# Patient Record
Sex: Male | Born: 1960 | Race: Black or African American | Hispanic: No | Marital: Single | Smoking: Never smoker
Health system: Southern US, Community
[De-identification: ages and names within clinical notes are randomized; demographics above are authoritative.]

## PROBLEM LIST (undated history)

## (undated) DIAGNOSIS — H35033 Hypertensive retinopathy, bilateral: Secondary | ICD-10-CM

## (undated) DIAGNOSIS — I10 Essential (primary) hypertension: Secondary | ICD-10-CM

## (undated) DIAGNOSIS — I639 Cerebral infarction, unspecified: Secondary | ICD-10-CM

## (undated) DIAGNOSIS — N189 Chronic kidney disease, unspecified: Secondary | ICD-10-CM

---

## 2003-08-20 ENCOUNTER — Emergency Department (HOSPITAL_COMMUNITY): Admission: EM | Admit: 2003-08-20 | Discharge: 2003-08-20 | Payer: Self-pay | Admitting: Emergency Medicine

## 2005-01-08 ENCOUNTER — Emergency Department (HOSPITAL_COMMUNITY): Admission: EM | Admit: 2005-01-08 | Discharge: 2005-01-08 | Payer: Self-pay | Admitting: Emergency Medicine

## 2005-03-08 ENCOUNTER — Emergency Department (HOSPITAL_COMMUNITY): Admission: EM | Admit: 2005-03-08 | Discharge: 2005-03-08 | Payer: Self-pay | Admitting: Emergency Medicine

## 2005-12-06 ENCOUNTER — Emergency Department (HOSPITAL_COMMUNITY): Admission: AD | Admit: 2005-12-06 | Discharge: 2005-12-06 | Payer: Self-pay | Admitting: Emergency Medicine

## 2005-12-11 ENCOUNTER — Emergency Department (HOSPITAL_COMMUNITY): Admission: EM | Admit: 2005-12-11 | Discharge: 2005-12-11 | Payer: Self-pay | Admitting: Family Medicine

## 2005-12-11 ENCOUNTER — Ambulatory Visit (HOSPITAL_COMMUNITY): Admission: RE | Admit: 2005-12-11 | Discharge: 2005-12-11 | Payer: Self-pay | Admitting: Family Medicine

## 2005-12-12 ENCOUNTER — Emergency Department (HOSPITAL_COMMUNITY): Admission: EM | Admit: 2005-12-12 | Discharge: 2005-12-12 | Payer: Self-pay | Admitting: Emergency Medicine

## 2005-12-14 ENCOUNTER — Inpatient Hospital Stay (HOSPITAL_COMMUNITY): Admission: EM | Admit: 2005-12-14 | Discharge: 2005-12-16 | Payer: Self-pay | Admitting: Emergency Medicine

## 2005-12-15 ENCOUNTER — Encounter (INDEPENDENT_AMBULATORY_CARE_PROVIDER_SITE_OTHER): Payer: Self-pay | Admitting: Cardiology

## 2006-03-27 ENCOUNTER — Inpatient Hospital Stay (HOSPITAL_COMMUNITY): Admission: EM | Admit: 2006-03-27 | Discharge: 2006-03-29 | Payer: Self-pay | Admitting: Emergency Medicine

## 2006-06-08 ENCOUNTER — Ambulatory Visit: Payer: Self-pay | Admitting: *Deleted

## 2006-08-17 ENCOUNTER — Ambulatory Visit: Payer: Self-pay | Admitting: Family Medicine

## 2006-09-16 ENCOUNTER — Ambulatory Visit: Payer: Self-pay | Admitting: Family Medicine

## 2006-09-29 ENCOUNTER — Ambulatory Visit: Payer: Self-pay | Admitting: Family Medicine

## 2006-11-16 ENCOUNTER — Ambulatory Visit: Payer: Self-pay | Admitting: Family Medicine

## 2006-12-14 ENCOUNTER — Encounter (INDEPENDENT_AMBULATORY_CARE_PROVIDER_SITE_OTHER): Payer: Self-pay | Admitting: Family Medicine

## 2006-12-14 ENCOUNTER — Ambulatory Visit: Payer: Self-pay | Admitting: Internal Medicine

## 2006-12-14 LAB — CONVERTED CEMR LAB
HDL: 29 mg/dL — ABNORMAL LOW (ref >39)
LDL Cholesterol: 127 mg/dL — ABNORMAL HIGH (ref 0–99)
Triglycerides: 228 mg/dL — ABNORMAL HIGH (ref <150)

## 2007-02-13 ENCOUNTER — Inpatient Hospital Stay (HOSPITAL_COMMUNITY): Admission: EM | Admit: 2007-02-13 | Discharge: 2007-02-16 | Payer: Self-pay | Admitting: Emergency Medicine

## 2007-06-08 ENCOUNTER — Ambulatory Visit: Payer: Self-pay | Admitting: Family Medicine

## 2007-07-20 ENCOUNTER — Ambulatory Visit: Payer: Self-pay | Admitting: Family Medicine

## 2007-07-20 LAB — CONVERTED CEMR LAB
BUN: 21 mg/dL (ref 6–23)
Cholesterol: 212 mg/dL — ABNORMAL HIGH (ref 0–200)
Creatinine, Ser: 1.68 mg/dL — ABNORMAL HIGH (ref 0.40–1.50)
Glucose, Bld: 111 mg/dL — ABNORMAL HIGH (ref 70–99)
HDL: 28 mg/dL — ABNORMAL LOW (ref >39)
Potassium: 4.3 meq/L (ref 3.5–5.3)
Total CHOL/HDL Ratio: 7.6
Triglycerides: 218 mg/dL — ABNORMAL HIGH (ref <150)

## 2007-09-02 ENCOUNTER — Ambulatory Visit: Payer: Self-pay | Admitting: Internal Medicine

## 2007-11-13 ENCOUNTER — Emergency Department (HOSPITAL_COMMUNITY): Admission: EM | Admit: 2007-11-13 | Discharge: 2007-11-13 | Payer: Self-pay | Admitting: Emergency Medicine

## 2007-11-23 ENCOUNTER — Inpatient Hospital Stay (HOSPITAL_COMMUNITY): Admission: EM | Admit: 2007-11-23 | Discharge: 2007-11-24 | Payer: Self-pay | Admitting: Emergency Medicine

## 2008-01-14 ENCOUNTER — Emergency Department (HOSPITAL_COMMUNITY): Admission: EM | Admit: 2008-01-14 | Discharge: 2008-01-14 | Payer: Self-pay | Admitting: Emergency Medicine

## 2008-10-11 ENCOUNTER — Emergency Department (HOSPITAL_COMMUNITY): Admission: EM | Admit: 2008-10-11 | Discharge: 2008-10-11 | Payer: Self-pay | Admitting: Emergency Medicine

## 2008-11-08 ENCOUNTER — Ambulatory Visit: Payer: Self-pay | Admitting: Family Medicine

## 2008-11-13 ENCOUNTER — Ambulatory Visit: Payer: Self-pay | Admitting: Internal Medicine

## 2009-08-31 ENCOUNTER — Inpatient Hospital Stay (HOSPITAL_COMMUNITY): Admission: EM | Admit: 2009-08-31 | Discharge: 2009-09-12 | Payer: Self-pay | Admitting: Emergency Medicine

## 2009-08-31 ENCOUNTER — Ambulatory Visit: Payer: Self-pay | Admitting: Cardiology

## 2009-09-01 ENCOUNTER — Encounter (INDEPENDENT_AMBULATORY_CARE_PROVIDER_SITE_OTHER): Payer: Self-pay | Admitting: Neurology

## 2009-09-03 ENCOUNTER — Ambulatory Visit: Payer: Self-pay | Admitting: Surgery

## 2009-09-03 ENCOUNTER — Encounter: Payer: Self-pay | Admitting: Cardiology

## 2009-09-06 ENCOUNTER — Ambulatory Visit: Payer: Self-pay | Admitting: Physical Medicine & Rehabilitation

## 2009-09-08 ENCOUNTER — Encounter (INDEPENDENT_AMBULATORY_CARE_PROVIDER_SITE_OTHER): Payer: Self-pay | Admitting: Neurology

## 2009-09-12 ENCOUNTER — Inpatient Hospital Stay (HOSPITAL_COMMUNITY)
Admission: RE | Admit: 2009-09-12 | Discharge: 2009-10-04 | Payer: Self-pay | Admitting: Physical Medicine & Rehabilitation

## 2009-09-15 ENCOUNTER — Ambulatory Visit: Payer: Self-pay | Admitting: Physical Medicine & Rehabilitation

## 2009-09-19 ENCOUNTER — Ambulatory Visit: Payer: Self-pay | Admitting: Psychology

## 2009-11-07 ENCOUNTER — Encounter
Admission: RE | Admit: 2009-11-07 | Discharge: 2009-11-13 | Payer: Self-pay | Admitting: Physical Medicine & Rehabilitation

## 2009-11-09 ENCOUNTER — Emergency Department (HOSPITAL_COMMUNITY): Admission: EM | Admit: 2009-11-09 | Discharge: 2009-11-09 | Payer: Self-pay | Admitting: Emergency Medicine

## 2009-11-13 ENCOUNTER — Ambulatory Visit: Payer: Self-pay | Admitting: Physical Medicine & Rehabilitation

## 2009-12-28 ENCOUNTER — Ambulatory Visit: Payer: Self-pay | Admitting: Family Medicine

## 2010-01-01 ENCOUNTER — Emergency Department (HOSPITAL_COMMUNITY): Admission: EM | Admit: 2010-01-01 | Discharge: 2010-01-01 | Payer: Self-pay | Admitting: Emergency Medicine

## 2010-01-30 ENCOUNTER — Emergency Department (HOSPITAL_COMMUNITY): Admission: EM | Admit: 2010-01-30 | Discharge: 2010-01-30 | Payer: Self-pay | Admitting: Emergency Medicine

## 2010-02-08 ENCOUNTER — Ambulatory Visit: Payer: Self-pay | Admitting: Family Medicine

## 2010-07-04 LAB — URINE MICROSCOPIC-ADD ON

## 2010-07-04 LAB — COMPREHENSIVE METABOLIC PANEL
ALT: 11 U/L (ref 0–53)
Albumin: 3.7 g/dL (ref 3.5–5.2)
BUN: 19 mg/dL (ref 6–23)
CO2: 24 mEq/L (ref 19–32)
Chloride: 106 mEq/L (ref 96–112)
GFR calc Af Amer: 57 mL/min — ABNORMAL LOW (ref >60)
Total Protein: 8 g/dL (ref 6.0–8.3)

## 2010-07-04 LAB — URINALYSIS, ROUTINE W REFLEX MICROSCOPIC
Bilirubin Urine: NEGATIVE
Glucose, UA: NEGATIVE mg/dL
Glucose, UA: NEGATIVE mg/dL
Hgb urine dipstick: NEGATIVE
Ketones, ur: NEGATIVE mg/dL
Leukocytes, UA: NEGATIVE
Nitrite: NEGATIVE
Nitrite: NEGATIVE
Protein, ur: 30 mg/dL — AB
Specific Gravity, Urine: 1.022 (ref 1.005–1.030)
Urobilinogen, UA: 0.2 mg/dL (ref 0.0–1.0)
pH: 6 (ref 5.0–8.0)

## 2010-07-04 LAB — TROPONIN I: Troponin I: 0.05 ng/mL (ref 0.00–0.06)

## 2010-07-04 LAB — CBC
Hemoglobin: 12.8 g/dL — ABNORMAL LOW (ref 13.0–17.0)
MCHC: 34.4 g/dL (ref 30.0–36.0)
MCV: 88.3 fL (ref 78.0–100.0)
Platelets: 257 10*3/uL (ref 150–400)

## 2010-07-04 LAB — DIFFERENTIAL
Basophils Absolute: 0 10*3/uL (ref 0.0–0.1)
Eosinophils Absolute: 0.2 10*3/uL (ref 0.0–0.7)
Eosinophils Relative: 1 % (ref 0–5)
Lymphocytes Relative: 8 % — ABNORMAL LOW (ref 12–46)
Neutro Abs: 9.4 10*3/uL — ABNORMAL HIGH (ref 1.7–7.7)
Neutrophils Relative %: 85 % — ABNORMAL HIGH (ref 43–77)

## 2010-07-04 LAB — LIPASE, BLOOD: Lipase: 81 U/L — ABNORMAL HIGH (ref 11–59)

## 2010-07-04 LAB — D-DIMER, QUANTITATIVE: D-Dimer, Quant: 0.23 ug/mL-FEU (ref 0.00–0.48)

## 2010-07-06 LAB — COMPREHENSIVE METABOLIC PANEL
AST: 15 U/L (ref 0–37)
Albumin: 3.4 g/dL — ABNORMAL LOW (ref 3.5–5.2)
Alkaline Phosphatase: 76 U/L (ref 39–117)
Chloride: 107 mEq/L (ref 96–112)
Creatinine, Ser: 1.77 mg/dL — ABNORMAL HIGH (ref 0.4–1.5)
GFR calc Af Amer: 49 mL/min — ABNORMAL LOW (ref >60)
Potassium: 3.7 mEq/L (ref 3.5–5.1)
Total Bilirubin: 0.4 mg/dL (ref 0.3–1.2)
Total Protein: 7.5 g/dL (ref 6.0–8.3)

## 2010-07-06 LAB — URINE MICROSCOPIC-ADD ON

## 2010-07-06 LAB — CBC
Platelets: 275 10*3/uL (ref 150–400)
RBC: 3.56 MIL/uL — ABNORMAL LOW (ref 4.22–5.81)
RDW: 13.9 % (ref 11.5–15.5)
WBC: 6.8 10*3/uL (ref 4.0–10.5)

## 2010-07-06 LAB — DIFFERENTIAL
Basophils Absolute: 0 10*3/uL (ref 0.0–0.1)
Basophils Relative: 0 % (ref 0–1)
Eosinophils Absolute: 0.3 10*3/uL (ref 0.0–0.7)
Eosinophils Relative: 4 % (ref 0–5)
Lymphocytes Relative: 15 % (ref 12–46)
Lymphs Abs: 1 10*3/uL (ref 0.7–4.0)
Monocytes Absolute: 0.6 10*3/uL (ref 0.1–1.0)
Monocytes Relative: 9 % (ref 3–12)
Neutro Abs: 4.9 10*3/uL (ref 1.7–7.7)
Neutrophils Relative %: 73 % (ref 43–77)

## 2010-07-06 LAB — APTT: aPTT: 31 seconds (ref 24–37)

## 2010-07-06 LAB — URINALYSIS, ROUTINE W REFLEX MICROSCOPIC
Hgb urine dipstick: NEGATIVE
Nitrite: NEGATIVE
Protein, ur: 30 mg/dL — AB
Specific Gravity, Urine: 1.02 (ref 1.005–1.030)

## 2010-07-06 LAB — URINE CULTURE
Colony Count: NO GROWTH
Culture: NO GROWTH

## 2010-07-06 LAB — PROTIME-INR
INR: 1.37 (ref 0.00–1.49)
Prothrombin Time: 16.8 seconds — ABNORMAL HIGH (ref 11.6–15.2)

## 2010-07-07 LAB — PROTIME-INR: Prothrombin Time: 26 seconds — ABNORMAL HIGH (ref 11.6–15.2)

## 2010-07-08 LAB — BASIC METABOLIC PANEL
BUN: 26 mg/dL — ABNORMAL HIGH (ref 6–23)
BUN: 26 mg/dL — ABNORMAL HIGH (ref 6–23)
BUN: 30 mg/dL — ABNORMAL HIGH (ref 6–23)
BUN: 33 mg/dL — ABNORMAL HIGH (ref 6–23)
BUN: 35 mg/dL — ABNORMAL HIGH (ref 6–23)
CO2: 21 mEq/L (ref 19–32)
CO2: 23 mEq/L (ref 19–32)
CO2: 24 mEq/L (ref 19–32)
CO2: 26 mEq/L (ref 19–32)
CO2: 26 mEq/L (ref 19–32)
Calcium: 9 mg/dL (ref 8.4–10.5)
Calcium: 9.1 mg/dL (ref 8.4–10.5)
Calcium: 9.2 mg/dL (ref 8.4–10.5)
Calcium: 9.2 mg/dL (ref 8.4–10.5)
Calcium: 9.3 mg/dL (ref 8.4–10.5)
Calcium: 9.4 mg/dL (ref 8.4–10.5)
Calcium: 9.5 mg/dL (ref 8.4–10.5)
Calcium: 9.5 mg/dL (ref 8.4–10.5)
Chloride: 100 mEq/L (ref 96–112)
Creatinine, Ser: 1.11 mg/dL (ref 0.4–1.5)
Creatinine, Ser: 1.47 mg/dL (ref 0.4–1.5)
Creatinine, Ser: 1.5 mg/dL (ref 0.4–1.5)
GFR calc Af Amer: >60 mL/min (ref >60)
GFR calc Af Amer: >60 mL/min (ref >60)
GFR calc Af Amer: >60 mL/min (ref >60)
GFR calc Af Amer: >60 mL/min (ref >60)
GFR calc non Af Amer: 49 mL/min — ABNORMAL LOW (ref >60)
GFR calc non Af Amer: 51 mL/min — ABNORMAL LOW (ref >60)
GFR calc non Af Amer: 53 mL/min — ABNORMAL LOW (ref >60)
GFR calc non Af Amer: 53 mL/min — ABNORMAL LOW (ref >60)
GFR calc non Af Amer: 55 mL/min — ABNORMAL LOW (ref >60)
GFR calc non Af Amer: 57 mL/min — ABNORMAL LOW (ref >60)
GFR calc non Af Amer: >60 mL/min (ref >60)
GFR calc non Af Amer: >60 mL/min (ref >60)
Glucose, Bld: 124 mg/dL — ABNORMAL HIGH (ref 70–99)
Glucose, Bld: 126 mg/dL — ABNORMAL HIGH (ref 70–99)
Glucose, Bld: 127 mg/dL — ABNORMAL HIGH (ref 70–99)
Glucose, Bld: 135 mg/dL — ABNORMAL HIGH (ref 70–99)
Glucose, Bld: 135 mg/dL — ABNORMAL HIGH (ref 70–99)
Potassium: 3.6 mEq/L (ref 3.5–5.1)
Potassium: 3.6 mEq/L (ref 3.5–5.1)
Potassium: 3.6 mEq/L (ref 3.5–5.1)
Potassium: 3.9 mEq/L (ref 3.5–5.1)
Sodium: 135 mEq/L (ref 135–145)
Sodium: 136 mEq/L (ref 135–145)
Sodium: 136 mEq/L (ref 135–145)
Sodium: 137 mEq/L (ref 135–145)
Sodium: 137 mEq/L (ref 135–145)
Sodium: 139 mEq/L (ref 135–145)

## 2010-07-08 LAB — PROTIME-INR
INR: 1.92 — ABNORMAL HIGH (ref 0.00–1.49)
INR: 2.04 — ABNORMAL HIGH (ref 0.00–1.49)
INR: 2.14 — ABNORMAL HIGH (ref 0.00–1.49)
INR: 2.25 — ABNORMAL HIGH (ref 0.00–1.49)
INR: 2.45 — ABNORMAL HIGH (ref 0.00–1.49)
INR: 1.33 (ref 0.00–1.49)
INR: 2 — ABNORMAL HIGH (ref 0.00–1.49)
INR: 2.11 — ABNORMAL HIGH (ref 0.00–1.49)
INR: 2.36 — ABNORMAL HIGH (ref 0.00–1.49)
Prothrombin Time: 20.7 seconds — ABNORMAL HIGH (ref 11.6–15.2)
Prothrombin Time: 21.8 seconds — ABNORMAL HIGH (ref 11.6–15.2)
Prothrombin Time: 23.7 seconds — ABNORMAL HIGH (ref 11.6–15.2)
Prothrombin Time: 24.1 seconds — ABNORMAL HIGH (ref 11.6–15.2)
Prothrombin Time: 24.7 seconds — ABNORMAL HIGH (ref 11.6–15.2)
Prothrombin Time: 26 seconds — ABNORMAL HIGH (ref 11.6–15.2)
Prothrombin Time: 26.4 seconds — ABNORMAL HIGH (ref 11.6–15.2)
Prothrombin Time: 28 seconds — ABNORMAL HIGH (ref 11.6–15.2)
Prothrombin Time: 16.4 seconds — ABNORMAL HIGH (ref 11.6–15.2)
Prothrombin Time: 25.6 seconds — ABNORMAL HIGH (ref 11.6–15.2)
Prothrombin Time: 30.7 seconds — ABNORMAL HIGH (ref 11.6–15.2)

## 2010-07-08 LAB — GLUCOSE, CAPILLARY
Glucose-Capillary: 105 mg/dL — ABNORMAL HIGH (ref 70–99)
Glucose-Capillary: 124 mg/dL — ABNORMAL HIGH (ref 70–99)
Glucose-Capillary: 126 mg/dL — ABNORMAL HIGH (ref 70–99)
Glucose-Capillary: 135 mg/dL — ABNORMAL HIGH (ref 70–99)
Glucose-Capillary: 137 mg/dL — ABNORMAL HIGH (ref 70–99)
Glucose-Capillary: 93 mg/dL (ref 70–99)
Glucose-Capillary: 98 mg/dL (ref 70–99)
Glucose-Capillary: 106 mg/dL — ABNORMAL HIGH (ref 70–99)
Glucose-Capillary: 113 mg/dL — ABNORMAL HIGH (ref 70–99)
Glucose-Capillary: 114 mg/dL — ABNORMAL HIGH (ref 70–99)
Glucose-Capillary: 114 mg/dL — ABNORMAL HIGH (ref 70–99)
Glucose-Capillary: 116 mg/dL — ABNORMAL HIGH (ref 70–99)
Glucose-Capillary: 117 mg/dL — ABNORMAL HIGH (ref 70–99)
Glucose-Capillary: 117 mg/dL — ABNORMAL HIGH (ref 70–99)
Glucose-Capillary: 118 mg/dL — ABNORMAL HIGH (ref 70–99)
Glucose-Capillary: 119 mg/dL — ABNORMAL HIGH (ref 70–99)
Glucose-Capillary: 119 mg/dL — ABNORMAL HIGH (ref 70–99)
Glucose-Capillary: 121 mg/dL — ABNORMAL HIGH (ref 70–99)
Glucose-Capillary: 121 mg/dL — ABNORMAL HIGH (ref 70–99)
Glucose-Capillary: 123 mg/dL — ABNORMAL HIGH (ref 70–99)
Glucose-Capillary: 124 mg/dL — ABNORMAL HIGH (ref 70–99)
Glucose-Capillary: 124 mg/dL — ABNORMAL HIGH (ref 70–99)
Glucose-Capillary: 124 mg/dL — ABNORMAL HIGH (ref 70–99)
Glucose-Capillary: 125 mg/dL — ABNORMAL HIGH (ref 70–99)
Glucose-Capillary: 125 mg/dL — ABNORMAL HIGH (ref 70–99)
Glucose-Capillary: 126 mg/dL — ABNORMAL HIGH (ref 70–99)
Glucose-Capillary: 127 mg/dL — ABNORMAL HIGH (ref 70–99)
Glucose-Capillary: 128 mg/dL — ABNORMAL HIGH (ref 70–99)
Glucose-Capillary: 129 mg/dL — ABNORMAL HIGH (ref 70–99)
Glucose-Capillary: 130 mg/dL — ABNORMAL HIGH (ref 70–99)
Glucose-Capillary: 131 mg/dL — ABNORMAL HIGH (ref 70–99)
Glucose-Capillary: 131 mg/dL — ABNORMAL HIGH (ref 70–99)
Glucose-Capillary: 133 mg/dL — ABNORMAL HIGH (ref 70–99)
Glucose-Capillary: 136 mg/dL — ABNORMAL HIGH (ref 70–99)
Glucose-Capillary: 137 mg/dL — ABNORMAL HIGH (ref 70–99)
Glucose-Capillary: 137 mg/dL — ABNORMAL HIGH (ref 70–99)
Glucose-Capillary: 138 mg/dL — ABNORMAL HIGH (ref 70–99)
Glucose-Capillary: 138 mg/dL — ABNORMAL HIGH (ref 70–99)
Glucose-Capillary: 139 mg/dL — ABNORMAL HIGH (ref 70–99)
Glucose-Capillary: 140 mg/dL — ABNORMAL HIGH (ref 70–99)
Glucose-Capillary: 140 mg/dL — ABNORMAL HIGH (ref 70–99)
Glucose-Capillary: 140 mg/dL — ABNORMAL HIGH (ref 70–99)
Glucose-Capillary: 141 mg/dL — ABNORMAL HIGH (ref 70–99)
Glucose-Capillary: 143 mg/dL — ABNORMAL HIGH (ref 70–99)
Glucose-Capillary: 143 mg/dL — ABNORMAL HIGH (ref 70–99)
Glucose-Capillary: 145 mg/dL — ABNORMAL HIGH (ref 70–99)
Glucose-Capillary: 145 mg/dL — ABNORMAL HIGH (ref 70–99)
Glucose-Capillary: 149 mg/dL — ABNORMAL HIGH (ref 70–99)
Glucose-Capillary: 152 mg/dL — ABNORMAL HIGH (ref 70–99)
Glucose-Capillary: 158 mg/dL — ABNORMAL HIGH (ref 70–99)
Glucose-Capillary: 182 mg/dL — ABNORMAL HIGH (ref 70–99)

## 2010-07-08 LAB — CBC
HCT: 38.3 % — ABNORMAL LOW (ref 39.0–52.0)
HCT: 38.5 % — ABNORMAL LOW (ref 39.0–52.0)
HCT: 38.5 % — ABNORMAL LOW (ref 39.0–52.0)
Hemoglobin: 13 g/dL (ref 13.0–17.0)
Hemoglobin: 13.3 g/dL (ref 13.0–17.0)
Hemoglobin: 13.4 g/dL (ref 13.0–17.0)
Hemoglobin: 14 g/dL (ref 13.0–17.0)
MCHC: 33.7 g/dL (ref 30.0–36.0)
MCHC: 33.8 g/dL (ref 30.0–36.0)
MCHC: 34 g/dL (ref 30.0–36.0)
MCHC: 34.3 g/dL (ref 30.0–36.0)
MCHC: 34.5 g/dL (ref 30.0–36.0)
MCHC: 34.6 g/dL (ref 30.0–36.0)
MCV: 89.8 fL (ref 78.0–100.0)
MCV: 90.4 fL (ref 78.0–100.0)
Platelets: 225 10*3/uL (ref 150–400)
Platelets: 236 10*3/uL (ref 150–400)
Platelets: 248 10*3/uL (ref 150–400)
Platelets: 256 10*3/uL (ref 150–400)
Platelets: 275 10*3/uL (ref 150–400)
Platelets: 354 10*3/uL (ref 150–400)
Platelets: 381 10*3/uL (ref 150–400)
RBC: 4.21 MIL/uL — ABNORMAL LOW (ref 4.22–5.81)
RBC: 4.35 MIL/uL (ref 4.22–5.81)
RBC: 4.47 MIL/uL (ref 4.22–5.81)
RDW: 12.8 % (ref 11.5–15.5)
RDW: 12.8 % (ref 11.5–15.5)
RDW: 12.9 % (ref 11.5–15.5)
RDW: 13.1 % (ref 11.5–15.5)
RDW: 13.4 % (ref 11.5–15.5)
RDW: 13.8 % (ref 11.5–15.5)
RDW: 14 % (ref 11.5–15.5)
WBC: 13.1 10*3/uL — ABNORMAL HIGH (ref 4.0–10.5)
WBC: 14.4 10*3/uL — ABNORMAL HIGH (ref 4.0–10.5)
WBC: 15.9 10*3/uL — ABNORMAL HIGH (ref 4.0–10.5)

## 2010-07-08 LAB — DIFFERENTIAL
Basophils Absolute: 0 10*3/uL (ref 0.0–0.1)
Basophils Absolute: 0 10*3/uL (ref 0.0–0.1)
Basophils Relative: 0 % (ref 0–1)
Basophils Relative: 0 % (ref 0–1)
Eosinophils Absolute: 0.1 10*3/uL (ref 0.0–0.7)
Eosinophils Relative: 0 % (ref 0–5)
Lymphocytes Relative: 10 % — ABNORMAL LOW (ref 12–46)
Lymphocytes Relative: 10 % — ABNORMAL LOW (ref 12–46)
Lymphocytes Relative: 8 % — ABNORMAL LOW (ref 12–46)
Lymphocytes Relative: 8 % — ABNORMAL LOW (ref 12–46)
Lymphs Abs: 1.1 10*3/uL (ref 0.7–4.0)
Lymphs Abs: 1.3 10*3/uL (ref 0.7–4.0)
Monocytes Absolute: 1.1 10*3/uL — ABNORMAL HIGH (ref 0.1–1.0)
Monocytes Absolute: 1.2 10*3/uL — ABNORMAL HIGH (ref 0.1–1.0)
Monocytes Absolute: 1.3 10*3/uL — ABNORMAL HIGH (ref 0.1–1.0)
Monocytes Relative: 10 % (ref 3–12)
Monocytes Relative: 8 % (ref 3–12)
Monocytes Relative: 8 % (ref 3–12)
Monocytes Relative: 9 % (ref 3–12)
Neutro Abs: 10.5 10*3/uL — ABNORMAL HIGH (ref 1.7–7.7)
Neutro Abs: 10.7 10*3/uL — ABNORMAL HIGH (ref 1.7–7.7)
Neutro Abs: 10.7 10*3/uL — ABNORMAL HIGH (ref 1.7–7.7)
Neutro Abs: 12.3 10*3/uL — ABNORMAL HIGH (ref 1.7–7.7)
Neutrophils Relative %: 81 % — ABNORMAL HIGH (ref 43–77)
Neutrophils Relative %: 85 % — ABNORMAL HIGH (ref 43–77)

## 2010-07-08 LAB — IRON AND TIBC
Iron: 31 ug/dL — ABNORMAL LOW (ref 42–135)
Saturation Ratios: 14 % — ABNORMAL LOW (ref 20–55)
TIBC: 229 ug/dL (ref 215–435)
UIBC: 198 ug/dL

## 2010-07-08 LAB — COMPREHENSIVE METABOLIC PANEL
ALT: 11 U/L (ref 0–53)
Albumin: 2.7 g/dL — ABNORMAL LOW (ref 3.5–5.2)
Albumin: 2.7 g/dL — ABNORMAL LOW (ref 3.5–5.2)
Alkaline Phosphatase: 66 U/L (ref 39–117)
Alkaline Phosphatase: 77 U/L (ref 39–117)
BUN: 33 mg/dL — ABNORMAL HIGH (ref 6–23)
Calcium: 8.7 mg/dL (ref 8.4–10.5)
Calcium: 9.4 mg/dL (ref 8.4–10.5)
Creatinine, Ser: 1.52 mg/dL — ABNORMAL HIGH (ref 0.4–1.5)
Glucose, Bld: 112 mg/dL — ABNORMAL HIGH (ref 70–99)
Glucose, Bld: 126 mg/dL — ABNORMAL HIGH (ref 70–99)
Potassium: 3.8 mEq/L (ref 3.5–5.1)
Potassium: 3.8 mEq/L (ref 3.5–5.1)
Sodium: 137 mEq/L (ref 135–145)
Total Protein: 7.2 g/dL (ref 6.0–8.3)
Total Protein: 8.4 g/dL — ABNORMAL HIGH (ref 6.0–8.3)

## 2010-07-08 LAB — URINALYSIS, MICROSCOPIC ONLY
Bilirubin Urine: NEGATIVE
Hgb urine dipstick: NEGATIVE
Specific Gravity, Urine: 1.021 (ref 1.005–1.030)
pH: 5.5 (ref 5.0–8.0)

## 2010-07-08 LAB — URINE CULTURE
Colony Count: NO GROWTH
Culture: NO GROWTH

## 2010-07-08 LAB — URINALYSIS, ROUTINE W REFLEX MICROSCOPIC
Bilirubin Urine: NEGATIVE
Glucose, UA: NEGATIVE mg/dL
Hgb urine dipstick: NEGATIVE
Protein, ur: 30 mg/dL — AB
Specific Gravity, Urine: 1.019 (ref 1.005–1.030)
Urobilinogen, UA: 1 mg/dL (ref 0.0–1.0)

## 2010-07-08 LAB — CARDIAC PANEL(CRET KIN+CKTOT+MB+TROPI)
CK, MB: 1.1 ng/mL (ref 0.3–4.0)
Relative Index: 0.8 (ref 0.0–2.5)
Total CK: 137 U/L (ref 7–232)
Troponin I: 0.07 ng/mL — ABNORMAL HIGH (ref 0.00–0.06)

## 2010-07-08 LAB — VITAMIN B12: Vitamin B-12: 325 pg/mL (ref 211–911)

## 2010-07-08 LAB — URINE MICROSCOPIC-ADD ON

## 2010-07-08 LAB — FERRITIN: Ferritin: 472 ng/mL — ABNORMAL HIGH (ref 22–322)

## 2010-07-08 LAB — MAGNESIUM: Magnesium: 2.4 mg/dL (ref 1.5–2.5)

## 2010-07-08 LAB — MRSA PCR SCREENING: MRSA by PCR: NEGATIVE

## 2010-07-08 LAB — APTT: aPTT: 50 seconds — ABNORMAL HIGH (ref 24–37)

## 2010-07-08 LAB — PHOSPHORUS: Phosphorus: 5.4 mg/dL — ABNORMAL HIGH (ref 2.3–4.6)

## 2010-07-08 LAB — AMMONIA: Ammonia: 26 umol/L (ref 11–35)

## 2010-07-09 LAB — CBC
HCT: 41 % (ref 39.0–52.0)
Hemoglobin: 13.9 g/dL (ref 13.0–17.0)
MCHC: 34.1 g/dL (ref 30.0–36.0)
MCV: 89.3 fL (ref 78.0–100.0)
MCV: 89.5 fL (ref 78.0–100.0)
Platelets: 244 10*3/uL (ref 150–400)
RBC: 4.59 MIL/uL (ref 4.22–5.81)
RBC: 4.7 MIL/uL (ref 4.22–5.81)
WBC: 10.5 10*3/uL (ref 4.0–10.5)
WBC: 12 10*3/uL — ABNORMAL HIGH (ref 4.0–10.5)

## 2010-07-09 LAB — GLUCOSE, CAPILLARY
Glucose-Capillary: 132 mg/dL — ABNORMAL HIGH (ref 70–99)
Glucose-Capillary: 152 mg/dL — ABNORMAL HIGH (ref 70–99)
Glucose-Capillary: 154 mg/dL — ABNORMAL HIGH (ref 70–99)
Glucose-Capillary: 173 mg/dL — ABNORMAL HIGH (ref 70–99)
Glucose-Capillary: 173 mg/dL — ABNORMAL HIGH (ref 70–99)

## 2010-07-09 LAB — URINALYSIS, ROUTINE W REFLEX MICROSCOPIC
Hgb urine dipstick: NEGATIVE
Nitrite: NEGATIVE
Protein, ur: 30 mg/dL — AB
Specific Gravity, Urine: 1.01 (ref 1.005–1.030)
Urobilinogen, UA: 0.2 mg/dL (ref 0.0–1.0)

## 2010-07-09 LAB — CARDIAC PANEL(CRET KIN+CKTOT+MB+TROPI)
CK, MB: 1.5 ng/mL (ref 0.3–4.0)
Relative Index: 0.8 (ref 0.0–2.5)
Total CK: 189 U/L (ref 7–232)
Troponin I: 0.11 ng/mL — ABNORMAL HIGH (ref 0.00–0.06)

## 2010-07-09 LAB — COMPREHENSIVE METABOLIC PANEL
ALT: 16 U/L (ref 0–53)
ALT: 16 U/L (ref 0–53)
AST: 22 U/L (ref 0–37)
AST: 24 U/L (ref 0–37)
Albumin: 3.7 g/dL (ref 3.5–5.2)
Alkaline Phosphatase: 91 U/L (ref 39–117)
CO2: 25 mEq/L (ref 19–32)
Calcium: 9.1 mg/dL (ref 8.4–10.5)
Calcium: 9.6 mg/dL (ref 8.4–10.5)
Chloride: 106 mEq/L (ref 96–112)
Creatinine, Ser: 1.3 mg/dL (ref 0.4–1.5)
GFR calc Af Amer: 55 mL/min — ABNORMAL LOW (ref >60)
GFR calc Af Amer: >60 mL/min (ref >60)
GFR calc non Af Amer: 45 mL/min — ABNORMAL LOW (ref >60)
Potassium: 3.2 mEq/L — ABNORMAL LOW (ref 3.5–5.1)
Sodium: 139 mEq/L (ref 135–145)
Sodium: 140 mEq/L (ref 135–145)

## 2010-07-09 LAB — PROTIME-INR: INR: 1.1 (ref 0.00–1.49)

## 2010-07-09 LAB — HEMOGLOBIN A1C
Hgb A1c MFr Bld: 6.7 % — ABNORMAL HIGH (ref <5.7)
Mean Plasma Glucose: 146 mg/dL — ABNORMAL HIGH (ref <117)

## 2010-07-09 LAB — LIPID PANEL
HDL: 22 mg/dL — ABNORMAL LOW (ref >39)
Total CHOL/HDL Ratio: 7.8 RATIO
Triglycerides: 251 mg/dL — ABNORMAL HIGH (ref <150)
VLDL: 50 mg/dL — ABNORMAL HIGH (ref 0–40)

## 2010-07-09 LAB — RAPID URINE DRUG SCREEN, HOSP PERFORMED
Amphetamines: NOT DETECTED
Cocaine: NOT DETECTED
Opiates: POSITIVE — AB
Tetrahydrocannabinol: POSITIVE — AB

## 2010-07-09 LAB — URINE MICROSCOPIC-ADD ON

## 2010-07-09 LAB — DIFFERENTIAL
Basophils Absolute: 0 10*3/uL (ref 0.0–0.1)
Basophils Relative: 0 % (ref 0–1)
Lymphs Abs: 1.6 10*3/uL (ref 0.7–4.0)
Monocytes Absolute: 0.8 10*3/uL (ref 0.1–1.0)
Monocytes Relative: 8 % (ref 3–12)

## 2010-07-09 LAB — APTT: aPTT: 24 seconds (ref 24–37)

## 2010-07-09 LAB — TROPONIN I: Troponin I: 0.1 ng/mL — ABNORMAL HIGH (ref 0.00–0.06)

## 2010-07-09 LAB — SEDIMENTATION RATE: Sed Rate: 38 mm/hr — ABNORMAL HIGH (ref 0–16)

## 2010-07-09 LAB — CK TOTAL AND CKMB (NOT AT ARMC): Total CK: 307 U/L — ABNORMAL HIGH (ref 7–232)

## 2010-07-29 LAB — DIFFERENTIAL
Basophils Absolute: 0 10*3/uL (ref 0.0–0.1)
Basophils Relative: 0 % (ref 0–1)
Neutro Abs: 6.1 10*3/uL (ref 1.7–7.7)
Neutrophils Relative %: 76 % (ref 43–77)

## 2010-07-29 LAB — POCT CARDIAC MARKERS
CKMB, poc: 2.3 ng/mL (ref 1.0–8.0)
Myoglobin, poc: 109 ng/mL (ref 12–200)

## 2010-07-29 LAB — URINALYSIS, ROUTINE W REFLEX MICROSCOPIC
Glucose, UA: 100 mg/dL — AB
Hgb urine dipstick: NEGATIVE
Specific Gravity, Urine: 1.016 (ref 1.005–1.030)
Urobilinogen, UA: 0.2 mg/dL (ref 0.0–1.0)
pH: 5.5 (ref 5.0–8.0)

## 2010-07-29 LAB — URINE MICROSCOPIC-ADD ON

## 2010-07-29 LAB — CBC
MCHC: 34.5 g/dL (ref 30.0–36.0)
RBC: 4.43 MIL/uL (ref 4.22–5.81)
RDW: 14.2 % (ref 11.5–15.5)

## 2010-07-29 LAB — BASIC METABOLIC PANEL
CO2: 22 mEq/L (ref 19–32)
Calcium: 8.5 mg/dL (ref 8.4–10.5)
Creatinine, Ser: 1.54 mg/dL — ABNORMAL HIGH (ref 0.4–1.5)
GFR calc Af Amer: 58 mL/min — ABNORMAL LOW (ref >60)

## 2010-08-31 ENCOUNTER — Emergency Department (HOSPITAL_COMMUNITY): Payer: Medicaid Other

## 2010-08-31 ENCOUNTER — Emergency Department (HOSPITAL_COMMUNITY)
Admission: EM | Admit: 2010-08-31 | Discharge: 2010-08-31 | Disposition: A | Discharge status: Home / Self Care | Payer: Medicaid Other | Attending: Emergency Medicine | Admitting: Emergency Medicine

## 2010-08-31 DIAGNOSIS — I129 Hypertensive chronic kidney disease with stage 1 through stage 4 chronic kidney disease, or unspecified chronic kidney disease: Secondary | ICD-10-CM | POA: Insufficient documentation

## 2010-08-31 DIAGNOSIS — Z8679 Personal history of other diseases of the circulatory system: Secondary | ICD-10-CM | POA: Insufficient documentation

## 2010-08-31 DIAGNOSIS — R109 Unspecified abdominal pain: Secondary | ICD-10-CM | POA: Insufficient documentation

## 2010-08-31 DIAGNOSIS — E119 Type 2 diabetes mellitus without complications: Secondary | ICD-10-CM | POA: Insufficient documentation

## 2010-08-31 DIAGNOSIS — K5900 Constipation, unspecified: Secondary | ICD-10-CM | POA: Insufficient documentation

## 2010-08-31 DIAGNOSIS — N189 Chronic kidney disease, unspecified: Secondary | ICD-10-CM | POA: Insufficient documentation

## 2010-08-31 LAB — BASIC METABOLIC PANEL
BUN: 18 mg/dL (ref 6–23)
CO2: 28 mEq/L (ref 19–32)
Chloride: 106 mEq/L (ref 96–112)
Glucose, Bld: 103 mg/dL — ABNORMAL HIGH (ref 70–99)
Potassium: 4.2 mEq/L (ref 3.5–5.1)

## 2010-08-31 LAB — URINALYSIS, ROUTINE W REFLEX MICROSCOPIC
Bilirubin Urine: NEGATIVE
Ketones, ur: NEGATIVE mg/dL
Leukocytes, UA: NEGATIVE
Nitrite: NEGATIVE
Protein, ur: 100 mg/dL — AB
Urobilinogen, UA: 0.2 mg/dL (ref 0.0–1.0)

## 2010-08-31 LAB — GLUCOSE, CAPILLARY

## 2010-08-31 LAB — URINE MICROSCOPIC-ADD ON

## 2010-09-03 NOTE — Cardiovascular Report (Signed)
Matthew Guerra, Matthew Guerra               ACCOUNT NO.:  [id number]   MEDICAL RECORD NO.:  [id number]          PATIENT TYPE:  INP   LOCATION:  2620                         FACILITY:  [facility]   PHYSICIAN:  Madaline Savage, M.D.DATE OF BIRTH:  11-May-1960   DATE OF PROCEDURE:  DATE OF DISCHARGE:                            CARDIAC CATHETERIZATION   PROCEDURES PERFORMED:  1. Selective coronary angiography by Judkins technique.  2. Retrograde left heart catheterization.  3. Left ventricular angiography.   COMPLICATIONS:  None.   ENTRY SITE:  Right femoral.   DYE USED:  Omnipaque.   CATHETERS USED:  5-French diagnostic catheters.   INTERVENTIONS:  None.   PATIENT PROFILE:  [name] is a 50 year old African American gentleman  who is a medical patient of [facility].  He presented to [facility] with chest pain that was upper sternal in nature, pressure like  in nature, somewhat improved with nitroglycerin, not aggravated by deep  breathing, not associated with cough.  No shortness of breath.  The  patient is very anxious.   The patient's past history shows that he donated a kidney to his sister  over 20 years ago.  He has a history of hepatitis B and also has a  history of diabetes and hypertension.  Da is 5 feet 9 inches and  weighs 230 pounds.  Today's cardiac catheterization was performed  electively without any complications and the patient tolerated procedure  well.   ANGIOGRAPHIC RESULTS:  The patient's coronary arteries are very large in  diameter and are clean in terms of absence of atherosclerosis or  blockage.  His left ventricle shows vigorous contractility of all wall  segments with an ejection fraction of 60%.  There is no mitral  regurgitation seen.  The left heart pressures were as follows:  152/8  end diastolic, 10 for the left ventricle, and 140/95 for the central  aorta.  No significant aortic valve gradient by pullback technique.  As  previously  mentioned, the patient was seen was angiographically patent  and LV function was normal.   PLAN:  The patient will be observed for at least overnight observation  and the possibility of pursuing GI studies will be discussed.           ______________________________  Madaline Savage, M.D.     WHG/MEDQ  D:  11/23/2007  T:  11/24/2007  Job:  [id number]   cc:   [facility]  [facility]

## 2010-09-03 NOTE — Discharge Summary (Signed)
Matthew Guerra, Matthew Guerra               ACCOUNT NO.:  [id number]   MEDICAL RECORD NO.:  [id number]          PATIENT TYPE:  INP   LOCATION:  2620                         FACILITY:  [facility]   PHYSICIAN:  Matthew Guerra, M.D.DATE OF BIRTH:  Jan 07, 1961   DATE OF ADMISSION:  11/23/2007  DATE OF DISCHARGE:  11/24/2007                               DISCHARGE SUMMARY   ADMITTING PHYSICIAN:  Matthew Savage, MD   DISCHARGING PHYSICIAN:  Matthew Iba, MD   DISCHARGE DIAGNOSES:  1. Chest pain, etiology unclear.  The patient underwent coronary      angiography by Dr. Elsie Guerra on November 23, 2007.  Cath revealed patent      coronaries, free of disease.  2. Probable pancreatitis, admitted with elevated pancreatic enzymes      which significantly improved.  3. History of hepatitis.  4. 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  5. Questionable history of hepatitis.  6. Hypertension.  7. Diabetes mellitus type 2.   This is a 50 year old African American who had no previous history of  coronary artery disease, but has a history of diabetes and hypertension  as well as hepatitis B and pancreatitis history in the past.  He has  been off his medications for several months secondary to financial  hardship and inability to afford them.  He developed chest pain and was  delivered to the emergency room by EMS.  The patient initially woke up  in the morning feeling fine, but he was getting ready to leave the  house, he experienced significant midsternal chest pain radiating to his  back and into the neck.  He also felt queasy diaphoretic, short of  breath, and described some sharp pain in the arms.  The patient called  EMS.  He was then delivered to [facility] Emergency Department.   Dr. Elsie Guerra saw the patient on admission and we felt like his EKG had  some nonspecific ST-T wave changes and Dr. Elsie Guerra felt like he would be  a reasonable candidate for  coronary angiography.   Guerra PROCEDURE:  Coronary angiography performed by Dr. Elsie Guerra on  November 23, 2007, it revealed very large in diameter and clean and free of  disease coronary arteries.  His left ventricle was vigorous and ejection  fraction was estimated at 60%.  There was no significant aortic gradient  pullback, and there was no significant mitral regurgitation.   The patient was transferred post cath to the Cardiac Unit for  observation.  Next morning, Dr. Mariah Guerra saw the patient and felt he is  stable for discharge home.  His groin did not have any signs of  ecchymosis or hematoma.   Guerra laboratories revealed cardiac enzymes with mildly elevated  creatinine kinase and initial lipase was 263 and amylase 280, but on the  day of discharge, lipase was normal at 52 and amylase close to normal at  131.  Lipid profile showed total cholesterol 178, triglycerides 188, HDL  19, and LDL 121.  Magnesium 2.1.  Hemoglobin A1c 6.0.  TSH 0.533.  Liver  function tests revealed normal values.  BMP showed sodium 138, potassium  3.9, chloride 104, CO2 38, glucose 119, BUN 11, and creatinine 1.29.   CBC showed white blood cell count 9.8, hemoglobin 12.3, hematocrit 36.7,  and platelet count 359,000.   DISCHARGE MEDICATIONS:  Clonidine 0.1 mg b.i.d., metoprolol 50 mg  b.i.d., and amlodipine 10 mg daily.   Followup appointment scheduled with Dr. Audria Guerra at [facility] on  April 11, 2008, at 3:45 p.m.  The patient was instructed to avoid  lifting, driving for 3 days, and increase activity slowly.       Matthew Guerra, P.A.       ______________________________  Matthew Guerra, M.D.    MK/MEDQ  D:  11/24/2007  T:  11/25/2007  Job:  [id number]   cc:   [facility]  Matthew Guerra, M.D.

## 2010-09-03 NOTE — Discharge Summary (Signed)
Matthew Guerra, Matthew Guerra               ACCOUNT NO.:  [id number]   MEDICAL RECORD NO.:  [id number]          PATIENT TYPE:  INP   LOCATION:  1442                         [facility]ACILITY:  [facility]   PHYSICIAN:  Michaelyn Barter, M.D. DATE OF BIRTH:  [birth year]   DATE OF ADMISSION:  02/13/2007  DATE OF DISCHARGE:  02/16/2007                               DISCHARGE SUMMARY   PRIMARY CARE DOCTOR:  Unassigned.   FINAL DIAGNOSES:  1. Acute pancreatitis.  2. Hypertension.  3. Dyslipidemia.   PROCEDURES:  1. Abdominal ultrasound, completed February 13, 2007.  2. CT scan of the abdomen without contrast, February 13, 2007.  3. CT scan of pelvis without contrast, February 13, 2007.   HISTORY OF PRESENT ILLNESS:  [name] is a 50 year old gentleman with a  past medical history of hepatitis B who indicated that he had developed  abdominal pain starting the morning of this admission.  The sensation  was described as a cramp type of sensation.  It reached approximately 10  out of intensity, and it radiated to his back.   For past medical history please see that dictated by Dr. Oneita Hurt.   HOSPITAL COURSE:  1. Acute pancreatitis.  An ultrasound of the patient's abdomen was      done on February 13, 2007.  It revealed a 5 mm gallbladder polyp.      No definite stones.  No changes of acute cholecystitis.  Pancreas      was poorly visualized. CT scan of the abdomen and pelvis was also      completed on February 13, 2007.  Retroperitoneal edema in the region      of the uncinate process of the pancreas and the fourth portion of      the duodenum was seen.  Pancreatitis was most likely diagnosis.  CT      scan of the pelvis revealed no acute findings in the pelvis.      Diverticulosis without diverticulitis was seen.  The patient was      started on p.r.n. pain medications and remained on these throughout      his hospital course.  By the date of discharge, the patient      indicated that his abdominal pain had  resolved.  2. Hypertension.  The patient's medications were adjusted over the      course of hospitalization to control his blood pressure, which had      been found to be significantly elevated at 164/120 on October 27,      52.  3. Dyslipidemia.  A fasting lipid profile revealed a cholesterol of      192, triglycerides of 224, and LDL of 125.  The patient was started      on niacin 100 mg p.o. b.i.d.   By the date of discharge the patient indicated that he felt better.  His  abdominal pain had resolved.  His vital signs:  His temperature was  97.9, his heart rate 86, respirations 18, blood pressure 127/80, O2  saturation 100% on room air.  His sugars were controlled.   The patient  was discharged home on:  1. Aspirin 81 mg p.o. daily.  2. Hydrochlorothiazide 25 mg daily.  3. Labetalol 200 mg p.o. b.i.d.  4. Niacin 100 mg p.o. b.i.d.  5. Lantus insulin.  He was told to resume his previous dose of      medication.      Michaelyn Barter, M.D.  Electronically Signed     OR/MEDQ  D:  03/28/2007  T:  03/29/2007  Job:  [id number]

## 2010-09-03 NOTE — H&P (Signed)
Matthew Guerra, Matthew Guerra               ACCOUNT NO.:  [id number]   MEDICAL RECORD NO.:  [id number]          PATIENT TYPE:  INP   LOCATION:  1442                         FACILITY:  [facility]   PHYSICIAN:  Madaline Savage, MD        DATE OF BIRTH:  20-Nov-1960   DATE OF ADMISSION:  02/13/2007  DATE OF DISCHARGE:                              HISTORY & PHYSICAL   PRIMARY CARE PHYSICIAN:  Unassigned.   CHIEF COMPLAINT:  Abdominal pain.   HISTORY OF PRESENT ILLNESS:  Matthew Guerra is a 50 year old gentleman with a  history of hypertension, diabetes mellitus, and history of hepatitis B  in the past, who comes in with sudden onset of abdominal pain since this  morning. He says he woke up with abdominal pain this morning and his  pain is located in his epigastrium. It feels like a cramp. He rates it a  10 out of 10 and he says that it radiates to his back. He denies having  any nausea and vomiting. He has not had any food today because of the  pain. He denies having any aggravating or relieving factors. He denies  any chest pain or any shortness of breath. No diarrhea or constipation.   PAST MEDICAL HISTORY:  1. Hypertension.  2. Diabetes mellitus.  3. History of dyslipidemia.  4. History of hepatitis B in the past.   PAST SURGICAL HISTORY:  1. He donated his left kidney to his sister.  2. He had left inguinal hernia repair.   ALLERGIES:  NO KNOWN DRUG ALLERGIES.   CURRENT MEDICATIONS:  He is on Lantus 20 units at bedtime. He says he is  also on 2 blood pressure pills and 1 pill for diabetes but he does not  remember the names of these medications.   SOCIAL HISTORY:  He lives alone. He is in a hotel room. He is single. He  has 2 kids. He does not smoke cigarettes. He says he last smoked  cigarettes in high school. He takes beer once a week. He says when he  drinks, he drinks only 2 cans of beer at a time. He says that he is sure  that he does not drink more than 4 days a month. He denies any  history  of drug abuse.   FAMILY HISTORY:  His father is in his 26's. He is apparently healthy.  His mother died in her 14's. He is not sure what she died from. His  sister is in her 86's. She has kidney problems and heart problems.   REVIEW OF SYSTEMS:  GENERAL:  Denies any recent weight loss or weight  gain. HEENT:  No headaches, no blurred vision, no sore throat.  CARDIOVASCULAR:  He has chest pain ABDOMEN:  Has abdominal pain but no  nausea and vomiting. No diarrhea or constipation. NEUROLOGIC:  Alert and  oriented times three.   PHYSICAL EXAMINATION:  VITAL SIGNS:  Temperature 98.1, pulse rate of 103  per minute. Blood pressure 150/95, respiratory rate 20 per minute.  Oxygen saturation 97% on room air.  HEENT:  Normocephalic and  atraumatic. Pupils are equal, round, and  reactive to light and accommodation. Moist mucous membranes.  NECK:  Supple. No JVD, no carotid bruits.  CARDIOVASCULAR:  S1 and S2. Regular rate and rhythm. No murmur, rub, or  gallop.  CHEST:  Clear to auscultation.  ABDOMEN:  Soft. There is epigastric tenderness. Bowel sounds are heard.   LABORATORY DATA:  She has a CT of the abdomen and pelvis without  contrast, which shows acute pancreatitis.   White count 11,200. Hemoglobin 14. Platelets 282,000. Potassium 3.4,  sodium 138, , bicarb 27, BUN 13, creatinine 1.33. Glucose of 101.  Amylase is 154. Lipase is 82. AST 24, ALT 18, alkaline phosphatase 61,  troponin is less than 0.05.   IMPRESSION:  1. Acute pancreatitis.  2. Diabetes mellitus.  3. Hypertension.   PLAN:  This is a 50 year old gentleman who comes in with abdominal pain  and has CT scan findings consistent with acute pancreatitis. He denies  any history of severe alcohol use. I will order ultrasound of the  abdomen to look for any gallstones. I will admit him and hydrate him  with IV fluids and use narcotics for pain. I will also get a fasting  lipid profile. I will keep him NPO at this time  and will advance diet  once his pain starts to subside. We will start him on Lantus 5 units at  this time, as he is NPO and I will ask him to bring his home  medications, which we can restart once we have it. We can consult  gastroenterology if needed. I will put him on DVT prophylaxis.      Madaline Savage, MD  Electronically Signed     PKN/MEDQ  D:  02/13/2007  T:  02/13/2007  Job:  [id number]

## 2010-09-03 NOTE — H&P (Signed)
NAMEFUMIO, VANDAM               ACCOUNT NO.:  [id number]   MEDICAL RECORD NO.:  [id number]          PATIENT TYPE:  INP   LOCATION:  1825                         FACILITY:  [facility]   PHYSICIAN:  Madaline Savage, M.D.DATE OF BIRTH:  18-Nov-1960   DATE OF ADMISSION:  11/23/2007  DATE OF DISCHARGE:                              HISTORY & PHYSICAL   CHIEF COMPLAINT:  Chest pain, midsternal.   HISTORY OF PRESENT ILLNESS:  A 50 year old single African American male  with no prior cardiac history but does have a history of diabetes type  2, hepatitis B and history of pancreatitis.  Has been off his  medications for several months secondary to financial issues.  Now  presents to the emergency room by EMS after having chest pain today.   The patient initially woke up this morning, was stable, fine, no  complaints, and as he was getting ready, he developed midsternal chest  pain, radiating to his back, believes up into his neck.  Not into his  arms.  He felt queasy, diaphoretic, short of breath, described as sharp  pain.  Did not go away quickly.  EMS was called.  He was given  nitroglycerin through the EMS which helped the pain but did not  completely relieve it.  He states this is different than any of his  previous pain.  He did have chest pain with hepatitis B, but he states  this is totally different.  He in the ER has already been started on  heparin and nitroglycerin.  Blood pressure still remains elevated and  continues with some chest tightness.   PAST MEDICAL HISTORY:  1. Hypertension.  2. Diabetes mellitus 2.  3. In 2007, he was diagnosed with acute hepatitis B.  4. He has a history of single kidney secondary to nephrectomy.  He was      a donor to his sister who was in renal failure.  5. He also has a history of inguinal hernia repair on the right.  6. Recently, the patient was seen here at [facility], diagnosed with      left upper lobe pneumonia and treated with  antibiotics.   FAMILY HISTORY:  Father is living, unknown history.  Mother died,  believe coronary disease.  One of his sisters does have coronary disease  with bypass grafting.  She is also the one with end-stage renal disease  on hemodialysis and recipient of his kidney.   SOCIAL HISTORY:  Single, lives alone.  He has 2 children, 2  grandchildren.  No tobacco.  Occasional alcohol.  He works for  [employer] with [employer] and denies any street drug use.   ALLERGIES:  No known allergies.   OUTPATIENT MEDICATIONS:  He has been on insulin in the past, Cardizem,  clonidine, hydrochlorothiazide, but has not taken any in several months  because he could not afford them.   REVIEW OF SYSTEMS:  GENERAL:  Recent pneumonia but has been feeling well  for the last week.  GASTROINTESTINAL:  He has had diarrhea with the  antibiotics and intermittent constipation.  SKIN:  No  rashes.  GENITOURINARY:  No hematuria or dysuria.  GASTROINTESTINAL:  As stated but no reflux.  NEUROLOGICAL:  No syncope.  No history of CVA.  CARDIOVASCULAR:  As described.  ENDOCRINE:  He is  diabetic, again untreated recently.  MUSCULOSKELETAL:  No arthritic  complaints.  PULMONARY:  Questionable asthma in the distant past,  nothing recent.  Unknown lipid status.   PHYSICAL EXAMINATION:  Blood pressure 136/92 to 145/99, respiratory rate  16, pulse initially was 110 - currently 89, temperature 98.7.  GENERAL:  Alert, oriented, African American male, appears uncomfortable.  HEENT:  Sclera clear.  Normocephalic.  NECK:  Supple.  No JVD, no bruits.  No adenopathy.  LUNGS:  Were clear without rales, rhonchi or wheezes.  No chest wall  pain to palpation.  HEART:  S1-S2 regular rate and rhythm without murmur, gallop or click.  ABDOMEN:  Mild right upper quadrant tenderness.  Hyperactive bowel  sounds.  No bruits detected.  EXTREMITIES:  No edema, 2+ pedal bilaterally, 2+ radials bilaterally.  SKIN:  Warm and dry.  Brisk  capillary refill.  NEUROLOGICAL:  Alert and oriented x3.  Moves all extremities and follows  commands.   LABORATORY DATA:  Hemoglobin 12.7, WBC slightly elevated at 12.3,  platelets 446, hematocrit 38; neutrophils are up at 86, lymph low at 8.  On the ISTAT 8, sodium 140, potassium 4.1, BUN 17, creatinine 1.7 and  glucose 125.  EKG:  Sinus rhythm with nonspecific ST changes.  Chest x-  ray:  Resolution of left upper lobe pneumonia with minimal residual  density at the previous site of the infiltrate.  Initial CK-MB was done  at 8:30:  CK 215, MB 2.3 and troponin I 0.08.  Cardiac markers were then  drawn at 8:50; CK-MB was 2.9, troponin was further elevated at 0.13 with  a myoglobin of 151.  Lipase is elevated at 263, and amylase is also  elevated at 280.   PLAN:  Initial plan had been to go to the catheterization lab but with  creatinine slightly elevated at 1.7 and 1 kidney as well as now elevated  lipase and amylase.  Will continue nitroglycerin and heparin.  Need to  further evaluate for pancreatitis versus gallstones, though in 2008,  abdominal ultrasound revealed a gallbladder polyp but no stones.  This  may need to repeated.  He has noted severe nausea and vomiting.  Will  keep him n.p.o. perhaps get a gallbladder ultrasound and then make  further decisions regarding cardiac catheterization and depending on  further troponin and CK-MBs.  M.D. is to see the patient and make  further recommendations.  We will also have case manager/social worker  see the patient concerning financial issues.      Darcella Gasman. Annie Paras, N.P.       ______________________________  Madaline Savage, M.D.    LRI/MEDQ  D:  11/23/2007  T:  11/23/2007  Job:  [id number]   cc:   Madaline Savage, M.D.  [facility]

## 2010-09-06 NOTE — Discharge Summary (Signed)
Matthew Guerra, Matthew Guerra               ACCOUNT NO.:  [id number]   MEDICAL RECORD NO.:  [id number]          PATIENT TYPE:  INP   LOCATION:  4715                         FACILITY:  [facility]   PHYSICIAN:  Hillery Aldo, M.D.   DATE OF BIRTH:  May 16, 1960   DATE OF ADMISSION:  12/13/2005  DATE OF DISCHARGE:  12/16/2005                                 DISCHARGE SUMMARY   PRIMARY CARE PHYSICIAN:  The patient is unassigned but will be referred to  [facility] for ongoing medical care.   DISCHARGE DIAGNOSES:  1. Noncardiac chest pain.  2. Acute hepatitis B.  3. Transaminitis.  4. Surgically absent left kidney.  5. Hypertension.  6. Fatty liver.  7. Dyslipidemia.   DISCHARGE MEDICATIONS:  1. Hydrochlorothiazide 25 mg daily.  2. Clonidine 0.1 mg b.i.d.  3. Cardizem CD 240 mg daily.   CONSULTATIONS:  None.   PROCEDURES AND DIAGNOSTIC STUDIES:  1. CT angiogram of the chest on December 13, 2005, showed no evidence of      pulmonary embolus.  No acute findings.  2. Chest x-ray on December 13, 2005, showed mild elevation of the right      hemidiaphragm with mild pleural fluid or scarring on the right.  3. Abdominal ultrasound on December 14, 2005, showed heterogeneous increased      echo texture of the liver, likely fatty infiltration.  Thick wall      gallbladder with small polyps.  Gallbladder bladder wall thickening may      be related to hypoalbuminemia.  Right kidney normal.  Left kidney is      surgically absent.  4. A 2-D echocardiogram on December 15, 2005, showed normal left ventricular      systolic function with an ejection fraction of 65-75%.  No left      ventricular regional wall motion abnormalities.  Left ventricular wall      thickness was mildly increased.  There was mild mitral valvular      regurgitation.   DISCHARGE LABORATORY VALUES:  Sodium was 136, potassium 4.1, chloride 104,  bicarb 25, BUN 11, creatinine 1.4, glucose 97.  White blood cell count was  7.3, hemoglobin 13.9,  hematocrit 41.8, platelets 180.  Total bilirubin was  3.3, alkaline phosphatase 131, AST 1668, ALT 2354.   BRIEF ADMISSION HISTORY OF PRESENT ILLNESS:  The patient is a 50 year old  male with a past medical history of hypertension who presented to the  emergency department with a chief complaint of chest pain.  He was admitted  for further evaluation and workup.   HOSPITAL COURSE:  1. Chest pain.  The patient was admitted to telemetry unit and cardiac      enzymes were cycled q.8 h x3 and found to be negative.  His EKG was      normal.  Due to an elevation of D-dimer, pulmonary embolus was ruled      out with CT angiogram findings as noted above.  Given the underlying      transaminitis, the patient's chest pain was thought to be referred pain      from his  liver.  His echocardiogram was normal.  No further diagnostic      workup was undertaken.  2. Acute hepatitis B.  Upon routine blood work, the patient was found to      have marked elevation in his liver function studies.  This prompted the      abdominal ultrasound with findings as noted above.  Hepatitis      serologies were checked and the patient was found to have serologies      consistent with acute hepatitis B.  His hepatitis C and hepatitis A      serologies were negative.  He was advised to avoid Tylenol and alcohol      and to follow up at [facility] for ongoing surveillance of his      transaminitis.  3. Surgically absent left kidney.  The patient was a donor for a kidney to      a sibling.  Renal function was stable.  4. Hypertension.  The patient did have fairly elevated blood pressures.      He was initially started on hydrochlorothiazide and further      antihypertensive medications were added to achieve blood pressure      control.  Prior to his hospitalization, he was only on Cardizem CD.  He      will need ongoing management of his hypertension as an outpatient.  5. Fatty liver.  The patient's ultrasound did show  changes consistent with      fatty liver.  He was advised to follow a low-fat diet.  6. Dyslipidemia.  The patient did have his lipid panel checked when risk      stratifying him for the possibility of coronary disease.  He was found      to have a total cholesterol 167, triglycerides 278, HDL of 16, and LDL      of 56.  He was advised on dietary changes and to try this for 6 months      and have his fasting lipid panel rechecked at that time.   DISPOSITION:  The patient is stable for discharge.  He will follow up at  [facility].  An appointment has been made for him and he was given the  information regarding his appointment time.  He should have his liver  function studies checked weekly until they normalize.      Hillery Aldo, M.D.  Electronically Signed     CR/MEDQ  D:  01/09/2006  T:  01/12/2006  Job:  [id number]

## 2010-09-06 NOTE — H&P (Signed)
NAMESHERI, PROWS               ACCOUNT NO.:  [id number]   MEDICAL RECORD NO.:  [id number]          PATIENT TYPE:  INP   LOCATION:  1826                         FACILITY:  [facility]   PHYSICIAN:  Andres Shad. Rudean Curt, MD     DATE OF BIRTH:  1960-06-09   DATE OF ADMISSION:  03/27/2006  DATE OF DISCHARGE:                              HISTORY & PHYSICAL   CHIEF COMPLAINT:  Abdominal pain and chest pain.   HISTORY OF PRESENT ILLNESS:  Mr. Kinner is a 50 year old man with a  history significant for hypertension and coronary artery disease.  He  came to the emergency department today with a history of abdominal pain  and chest pain of approximately 3 days' duration.  This was associated  with nausea but no vomiting.  The pain radiated to his back.  He also  complained of significant polyuria with nearly constant trips to the  bathroom to urinate.  He was also extremely thirsty and complained of a  dry mouth and throat.   REVIEW OF SYSTEMS:  Otherwise negative except for chills and weakness.   MEDICATIONS:  The patient currently takes no medications.  He says that  he is supposed to be on medications for his blood pressure and for his  heart but he is not currently taking any.   ALLERGIES:  NO KNOWN DRUG ALLERGIES.   SOCIAL HISTORY:  The patient lives alone.  He is a Electrical engineer.  He  denies a history of alcohol, tobacco, or drug use.   PAST MEDICAL HISTORY:  1. Hypertension.  2. 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  3. He was diagnosed with acute hepatitis B in September 2007.  4. He has also been diagnosed with fatty liver.  5. Dyslipidemia.   PHYSICAL EXAMINATION:  VITAL SIGNS:  Temperature 97.2, heart rate 127,  blood pressure 146/107, respiratory rate 16, pulse oximetry 96% on room  air.  GENERAL APPEARANCE:  The patient was a middle-aged [place] American male  who appeared tired but was alert and only mild discomfort.  HEENT:   Mucous membranes were very dry.  Sclerae were anicteric.  There  was no thyromegaly and no cervical lymphadenopathy.  LUNGS:  Clear to auscultation bilaterally.  CARDIOVASCULAR:  Regular, tachycardic, hyperdynamic.  Normal S1 and S2.  Positive S4 gallop.  ABDOMEN:  Normal bowel sounds.  Firm.  Slightly distended.  Moderately  tender in the epigastric area and the right upper quadrant.  EXTREMITIES:  Skin was very dry.  There was no edema noted.  NEUROLOGIC:  Grossly intact.   LABORATORY STUDIES:  White blood cell count 10.6, hemoglobin 15.3,  hematocrit 44.8, platelet count 262, white cell differential 86%  neutrophils, 9% lymphocytes, 4% monocytes, 1% eosinophils.  Electrolytes:  Sodium 130, potassium 4.7, chloride 98, bicarbonate 21,  BUN 22, creatinine 1.7, glucose 776.  Albumin 3.5, AST 18, ALT 21,  alkaline phosphatase 89, lipase 91.  Urinalysis:  Specific gravity was  1.040, glucose was greater than 1,000, ketones were 15, leukocyte  esterase was  negative.  CK-MB was 2.9, troponin less than 0.05.  Venous  pH was 7.4, pCO2 32.6.  Urine microscopy was negative except for yeast.  Chest x-ray was performed which was negative.   ASSESSMENT:  This is a 50 year old man with a new onset of diabetes  mellitus presenting with hypovolemia due to a hyperosmolar state.  He  has currently been managed with normal saline bolus in the emergency  department and a Glucomander has been ordered but has not yet been  started.   PLAN:  1. Hyperosmolar nonketotic syndrome.  The patient will continue on the      Glucomander which will continue until his serum glucose is below      200.  The patient has a small number of ketones in the urine and      does not have evidence of a metabolic acidosis.  In fact, his      bicarbonate is normal at 21 and his anion gap is only 1.  Thus, he      will be treated with aggressive hydration in addition to the      Glucomander which will be discontinued when his  glucose is below      200 for 4 straight hours.  2. New onset diabetes.  When the patient is clinically improved, the      diabetes teaching team will discuss home insulin therapy with him.  3. Hypertension.  The patient has not been treated for his      hypertension.  He said this is because his prescription ran out and      he did not refill it.  This will be revisited when he is doing      better.  His medications at his last hospital visit were Cardizem,      clonidine, and hydrochlorothiazide.  4. Hepatitis B.  This is a new diagnosis during his previous admission      to the hospital.  He has no evidence of active hepatitis at this      time.  5. Pancreatitis.  The patient has a mild elevation in his serum      lipase.  An abdominal ultrasound will be performed to evaluate for      gallstones or any pancreatic duct obstruction.      Andres Shad. Rudean Curt, MD  Electronically Signed     PML/MEDQ  D:  03/27/2006  T:  03/27/2006  Job:  [id number]

## 2010-09-06 NOTE — H&P (Signed)
Matthew Guerra, Matthew Guerra               ACCOUNT NO.:  [id number]   MEDICAL RECORD NO.:  [id number]          PATIENT TYPE:  INP   LOCATION:  4715                         FACILITY:  [facility]   PHYSICIAN:  Theresia Bough, MD       DATE OF BIRTH:  [birth year]   DATE OF ADMISSION:  12/13/2005  DATE OF DISCHARGE:                                HISTORY & PHYSICAL   PRIMARY CARE PHYSICIAN:  This patient has no primary care physician.   HISTORY OF THE PRESENTING COMPLAINTS:  Central chest pain.   HISTORY OF THE PRESENT ILLNESS:  This is a 50 year old African-American male  patient who came in at about 3 P.M. this afternoon because of chest pain.  The pain is located in the central chest area.  The pain is rated as a 6/10  in severity.  The pain does not radiate.  There are no aggravating or  alleviating factors.  He denies any diaphoresis.  He has had no coughing, no  wheezing and no shortness of breath.  The patient denies any headache,  dizziness or blurring of vision.  He has had no abdominal pain, nausea,  vomiting or diarrhea.  He also has had no dysuria and no abnormal urethral  discharge.  The patient denies any leg swelling and has no joint pains.   PAST MEDICAL HISTORY:  The past medical history includes a history of  hypertension.   MEDICATIONS:  The patient takes no medications for some years for his  hypertension.  However, the patient takes Cardizem CD 240 mg daily; the  patient just got this prescription within the past two days.  The patient  occasionally takes Tylenol for pain, but he has not taken any Tylenol within  the past few days.  The patient denies any history of excessive use of  Tylenol.   SOCIAL HISTORY:  The patient does not smoke cigarettes.  He does not drink  an alcohol.  He lives with his family.   FAMILY HISTORY:  The is a family history of diabetes and renal failure on  his mother's side.   ALLERGIES:  The patient has no known drug allergies.   PAST SURGICAL  HISTORY:  The patient has a history of hernia repair in the  past.  He also donated one kidney some years ago.   PHYSICAL EXAMINATION:  VITAL SIGNS:  The physical examination shows a blood  pressure of 133/83, pulse rate 77, respiratory rate 20 and temperature 97.5.  HEAD AND NECK:  The patient has pink conjunctivae.  He has no jaundice.  The  neck is supple.  The mucous membranes are moist.  CHEST:  The chest wall moves with respirations.  Palpation shows some  tenderness in the left costochondral area.  Auscultation shows clear breath  sounds.  HEART:  Cardiovascular exam shows first and second heart sounds.  No  murmurs.  No gallops.  No JVD and there is no peripheral edema.  ABDOMEN:  The abdomen is soft and nontender.  No masses are palpable.  The  patient has normal bowel sounds.  NEUROLOGIC EXAMINATION:  Central nervous system - the patient __________ .  Speech is clear.  Power is 4/4 in all extremities.  Sensation is intact.  The tongue is midline.  There is no facial asymmetry.  EXTREMITIES:  The extremities show no edema.  There is no joint swelling or  joint tenderness.   LABORATORY DATA:  The patient's blood work shows a WBC of 7.7, hemoglobin  14.5, hematocrit 47.3 and platelets 202,000.  Troponin is less than 0.05.  CK/MB is 2.0, which is normal.  Sodium 137, potassium 4.1, chloride 104,  bicarb 27, BUN 11, creatinine 1.5, and glucose 108.  Calcium 8.6.  Albumin  2.8, total protein 7.9, AST 1,165 and ALT 1,800; these are both elevated the  AST and ALT with a low albumin.  This is suggestive of possible chronic  liver disease.  Urinalysis is negative.  The chest x-ray shows mild  elevation in the right hemidiaphragm with mild pleural fluid or scarring on  the right.  The patient also had a CT of the chest done, which was negative  for pulmonary embolus.  There were no acute findings.  EKG showed normal  sinus rhythm and no ST elevation.   ASSESSMENT:  1. Chest pain.   This appears to be atypical chest pain.  The differential diagnosis includes  costochondritis.  The patient may also have gastroesophageal reflux disease  contributing to the pain.   However, because of his history of high blood pressure I am going to check  his cardiac enzymes q.8 hours times three.  The patient is also going to  have an echocardiogram in the A.M. to assess his cardiac function.  I will  give him Motrin for pain and Protonix 40 mg daily.   1. History of hypertension.   I will start the patient on Lopressor 50 mg p.o. twice a day.   1. Hepatitis.  The etiology is not clear at this time.   I am going to draw blood for viral hepatitis screening.  I will also obtain  a computerized tomography of the abdomen to check the patient's liver and  hepatic veins.  The patient will be hydrated with half normal saline at 75  mL per hour.  He will receive Motrin 600 mg p.o. q.8 hours p.r.n. for pain.      Theresia Bough, MD  Electronically Signed     GA/MEDQ  D:  12/14/2005  T:  12/14/2005  Job:  [id number]

## 2011-01-17 LAB — CBC
HCT: 36.7 — ABNORMAL LOW
HCT: 38.3 — ABNORMAL LOW
Hemoglobin: 12.3 — ABNORMAL LOW
Hemoglobin: 12.7 — ABNORMAL LOW
MCHC: 34.2
MCHC: 33.2
MCHC: 33.4
MCV: 88.1
MCV: 89.6
MCV: 90
Platelets: 359
RBC: 4.42
RBC: 4.1 — ABNORMAL LOW
RDW: 13.2
RDW: 13.5
RDW: 13.7
WBC: 9.8

## 2011-01-17 LAB — AMYLASE: Amylase: 139 — ABNORMAL HIGH

## 2011-01-17 LAB — BASIC METABOLIC PANEL
CO2: 22
CO2: 30
Calcium: 8.7
Chloride: 105
Creatinine, Ser: 1.46
Creatinine, Ser: 1.29
GFR calc Af Amer: >60
GFR calc Af Amer: >60
Glucose, Bld: 132 — ABNORMAL HIGH

## 2011-01-17 LAB — DIFFERENTIAL
Basophils Absolute: 0
Basophils Relative: 3 — ABNORMAL HIGH
Basophils Relative: 0
Eosinophils Absolute: 0
Eosinophils Relative: 2
Monocytes Absolute: 1.1 — ABNORMAL HIGH
Monocytes Absolute: 0.4
Monocytes Relative: 8
Neutrophils Relative %: 83 — ABNORMAL HIGH

## 2011-01-17 LAB — COMPREHENSIVE METABOLIC PANEL
ALT: 15
AST: 21
Alkaline Phosphatase: 67
CO2: 24
Chloride: 105
Creatinine, Ser: 1.52 — ABNORMAL HIGH
GFR calc Af Amer: 59 — ABNORMAL LOW
GFR calc non Af Amer: 49 — ABNORMAL LOW
Potassium: 4
Total Bilirubin: 0.4

## 2011-01-17 LAB — HEPATIC FUNCTION PANEL
ALT: 12
AST: 17
Albumin: 2.7 — ABNORMAL LOW
Alkaline Phosphatase: 66
Bilirubin, Direct: <0.1
Total Bilirubin: 0.5
Total Protein: 6.8

## 2011-01-17 LAB — LIPID PANEL
Cholesterol: 178
HDL: 19 — ABNORMAL LOW
LDL Cholesterol: 121 — ABNORMAL HIGH
Total CHOL/HDL Ratio: 9.4

## 2011-01-17 LAB — LIPASE, BLOOD: Lipase: 52

## 2011-01-17 LAB — POCT I-STAT, CHEM 8
Calcium, Ion: 1.16
Chloride: 105
Glucose, Bld: 125 — ABNORMAL HIGH
HCT: 39

## 2011-01-17 LAB — URINALYSIS, ROUTINE W REFLEX MICROSCOPIC
Bilirubin Urine: NEGATIVE
Hgb urine dipstick: NEGATIVE
Protein, ur: >300 — AB
Urobilinogen, UA: 1

## 2011-01-17 LAB — TROPONIN I: Troponin I: 0.08 — ABNORMAL HIGH

## 2011-01-17 LAB — TSH: TSH: 0.553

## 2011-01-20 LAB — DIFFERENTIAL
Eosinophils Relative: 3
Lymphocytes Relative: 22
Lymphs Abs: 1.8
Monocytes Absolute: 0.7
Monocytes Relative: 9

## 2011-01-20 LAB — POCT I-STAT, CHEM 8
BUN: 13
Calcium, Ion: 1.14
Chloride: 103
Creatinine, Ser: 1.5
Glucose, Bld: 113 — ABNORMAL HIGH

## 2011-01-20 LAB — CBC
HCT: 42.6
Hemoglobin: 14.5
RDW: 13.9

## 2011-01-20 LAB — POCT CARDIAC MARKERS
CKMB, poc: 3.7
Myoglobin, poc: 125

## 2011-01-29 LAB — CBC
HCT: 39.8
HCT: 41.2
Hemoglobin: 13.6
Hemoglobin: 14
MCV: 88.4
MCV: 89.5
Platelets: 282
RBC: 4.45
RDW: 13.3
WBC: 10.4
WBC: 11.2 — ABNORMAL HIGH

## 2011-01-29 LAB — LIPID PANEL
Cholesterol: 192
LDL Cholesterol: 125 — ABNORMAL HIGH
Triglycerides: 224 — ABNORMAL HIGH
VLDL: 45 — ABNORMAL HIGH

## 2011-01-29 LAB — HEMOGLOBIN A1C: Hgb A1c MFr Bld: 6.4 — ABNORMAL HIGH

## 2011-01-29 LAB — BASIC METABOLIC PANEL
CO2: 27
Chloride: 105
Creatinine, Ser: 1.2
GFR calc Af Amer: >60
Sodium: 138

## 2011-01-29 LAB — URINALYSIS, ROUTINE W REFLEX MICROSCOPIC
Leukocytes, UA: NEGATIVE
Protein, ur: >300 — AB
Urobilinogen, UA: 0.2

## 2011-01-29 LAB — CK TOTAL AND CKMB (NOT AT ARMC)
CK, MB: 4
Relative Index: 0.9
Total CK: 443 — ABNORMAL HIGH

## 2011-01-29 LAB — LIPASE, BLOOD
Lipase: 48
Lipase: 82 — ABNORMAL HIGH

## 2011-01-29 LAB — COMPREHENSIVE METABOLIC PANEL
Albumin: 3.8
BUN: 13
Chloride: 104
Creatinine, Ser: 1.33
Glucose, Bld: 101 — ABNORMAL HIGH
Total Bilirubin: 0.8

## 2011-01-29 LAB — DIFFERENTIAL
Basophils Absolute: 0
Basophils Relative: 0
Lymphocytes Relative: 10 — ABNORMAL LOW
Monocytes Absolute: 0.6
Neutro Abs: 9.3 — ABNORMAL HIGH
Neutrophils Relative %: 84 — ABNORMAL HIGH

## 2011-01-29 LAB — CARDIAC PANEL(CRET KIN+CKTOT+MB+TROPI)
CK, MB: 3.3
Relative Index: 0.9
Relative Index: 1.1
Total CK: 302 — ABNORMAL HIGH

## 2011-01-29 LAB — POCT CARDIAC MARKERS
CKMB, poc: 5.5
Troponin i, poc: <0.05

## 2011-01-29 LAB — URINE MICROSCOPIC-ADD ON

## 2011-01-29 LAB — TSH: TSH: 1.792

## 2011-02-26 IMAGING — CR DG ABDOMEN 2V
3 series · 3 of 3 positions shown · non-contrast
Comparison: 01/01/2010

CLINICAL DATA: Right flank pain, history of left donor nephrectomy

ABDOMEN - 2 VIEW

[w abdomen upright (1 of 2)]
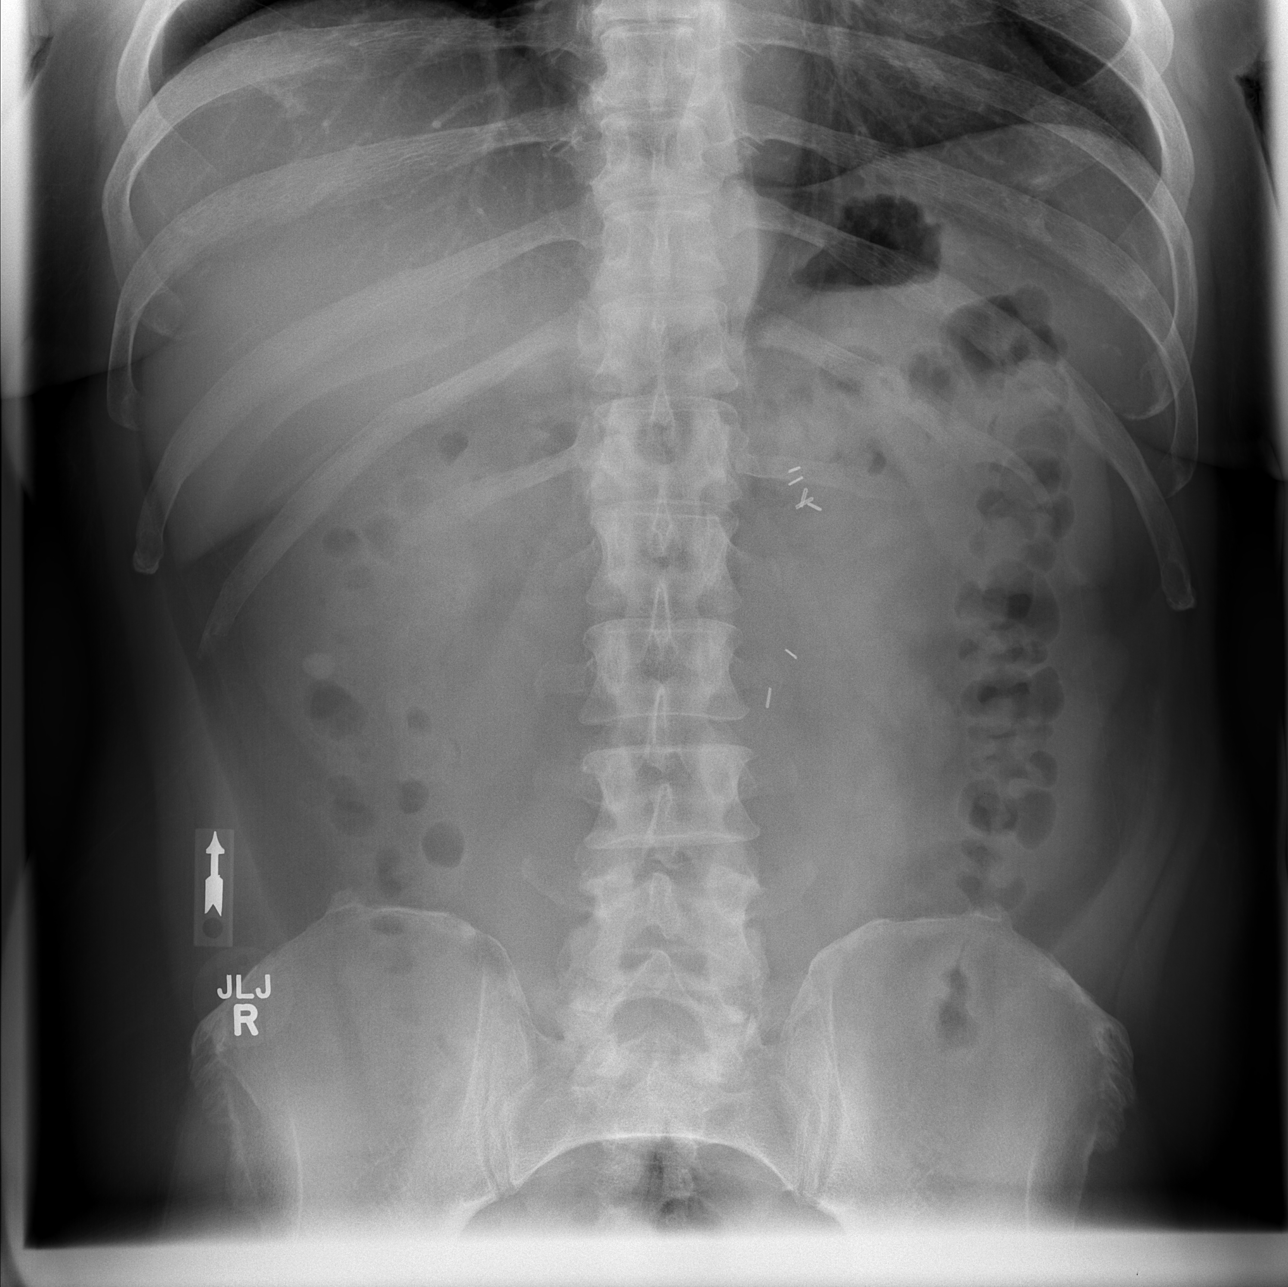

[w abdomen upright (2 of 2)]
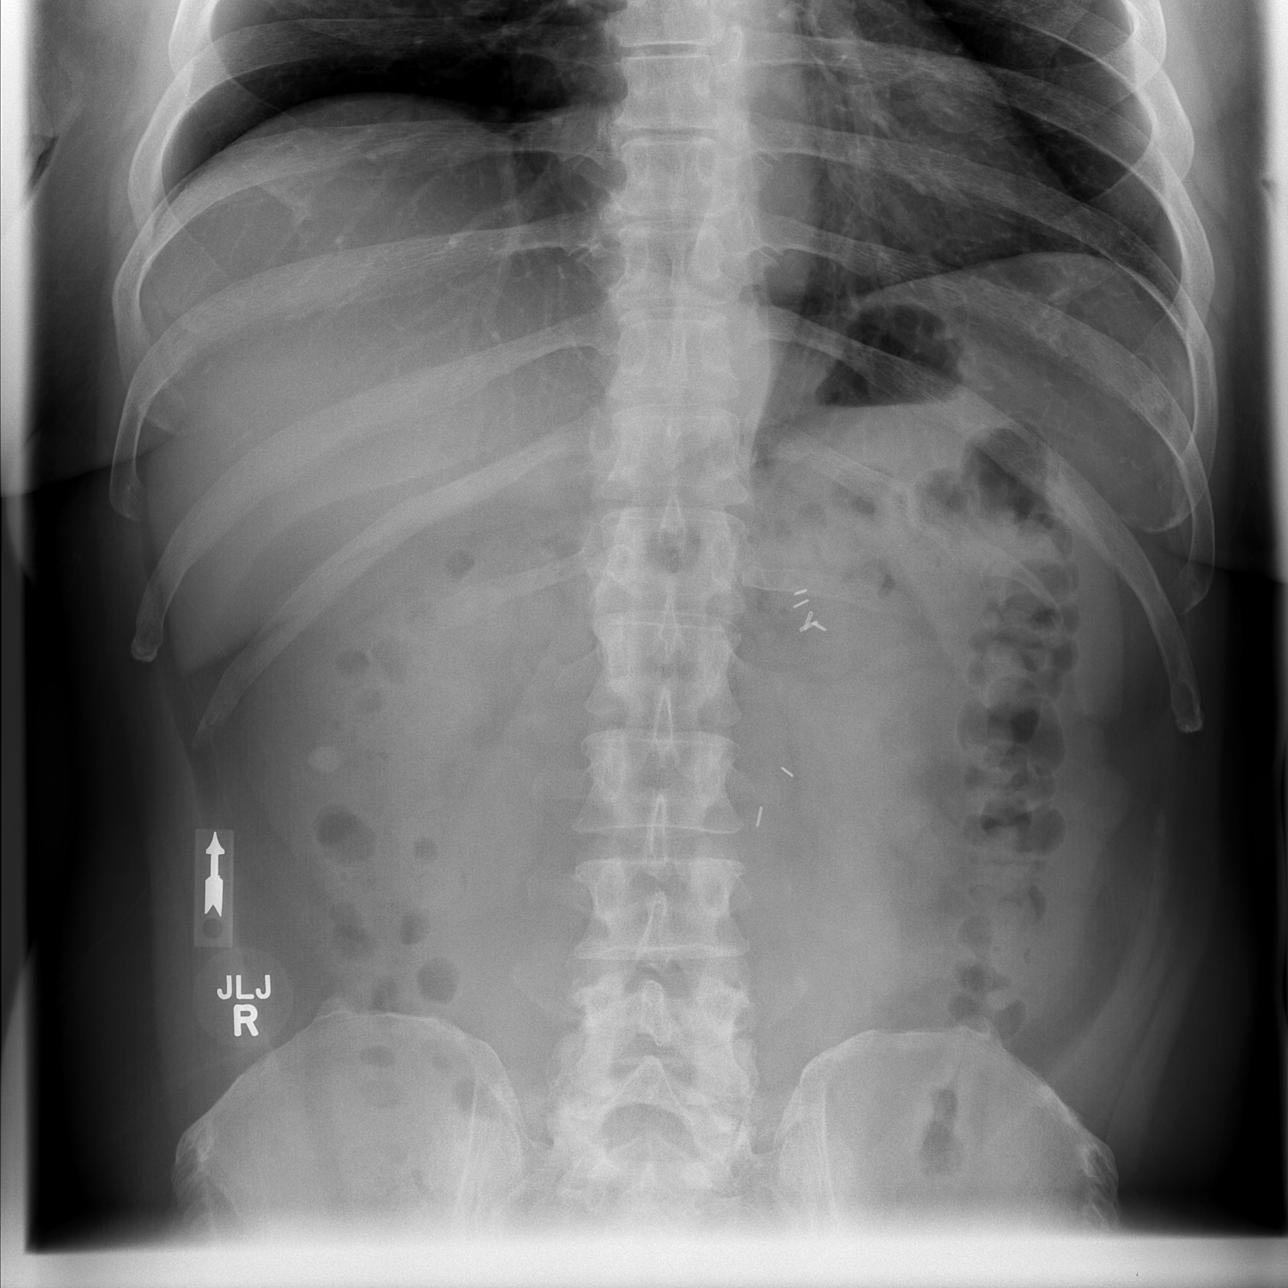

[t abdomen supine]
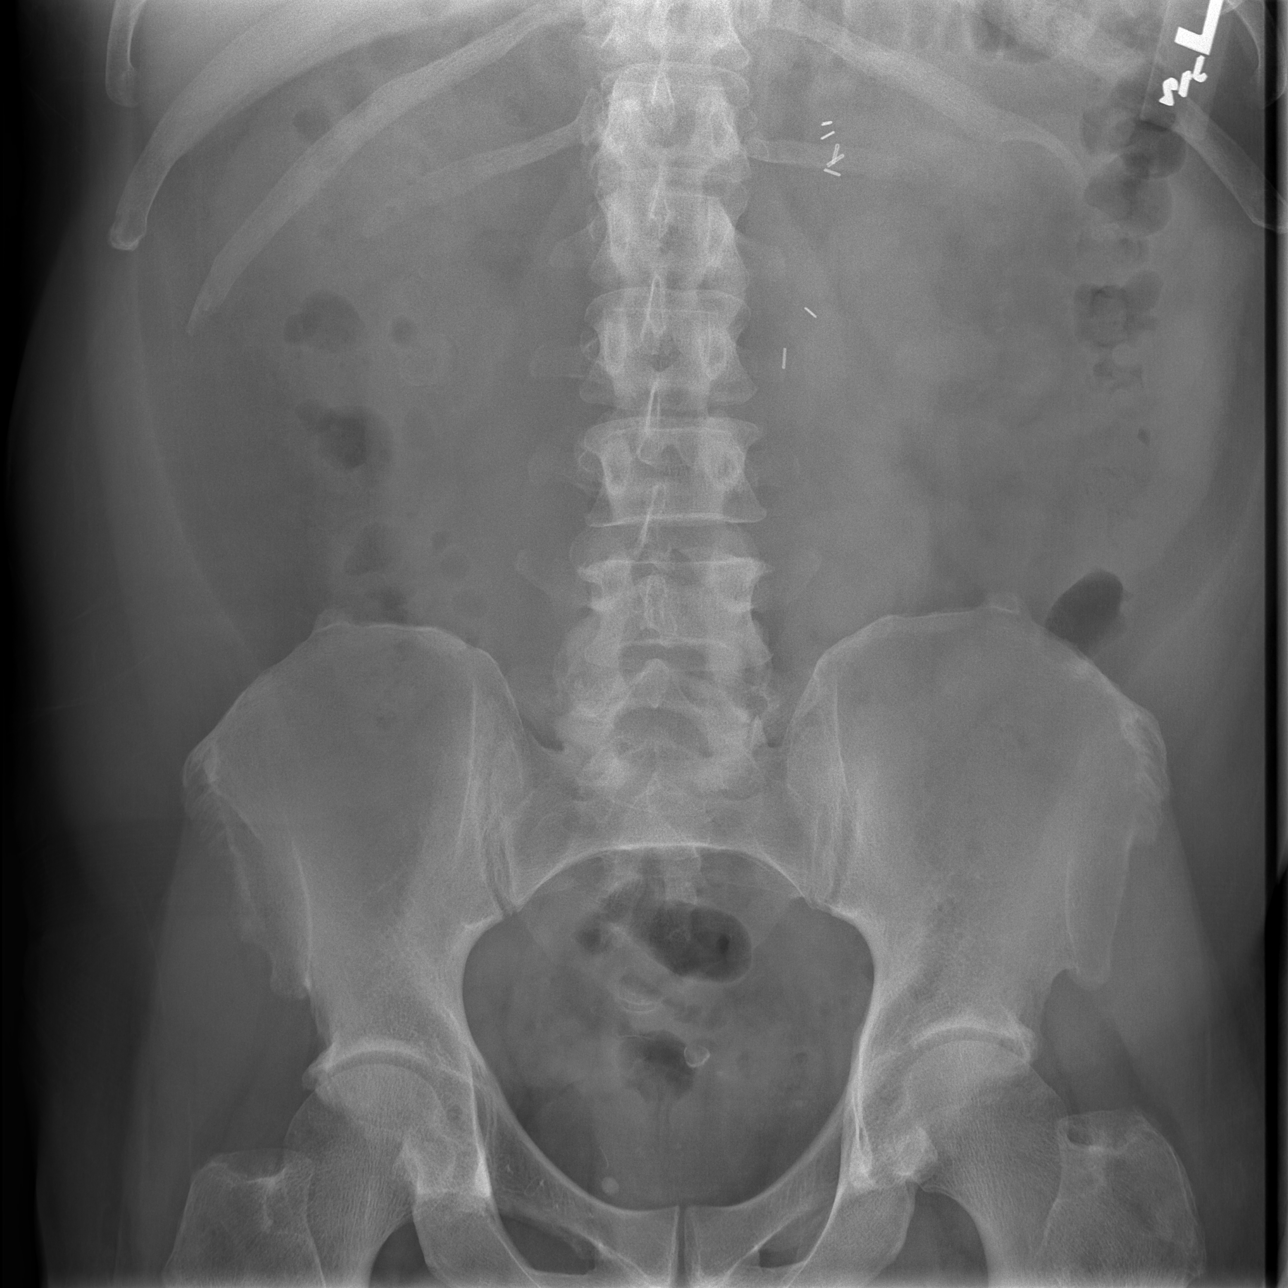

[3 of 3 positions shown; findings below may reference images not displayed]

FINDINGS: Surgical clips left paraspinal from donor nephrectomy.
11 x 9 mm diameter calcification projects over lateral aspect of
right kidney on upright view but not on supine view, uncertain
etiology.
This may represent a superimposed soft tissue artifact, and was
present on the upright view of the prior study.
On the supine film of the prior exam this abnormality was not
localized.
Nonobstructive bowel gas pattern.
No bowel dilatation, bowel wall thickening, or free intraperitoneal
air.
Lung bases grossly clear.
No acute osseous findings.
Small pelvic phleboliths.
Probable retained contrast within a  distal sigmoid diverticulum.
IMPRESSION: No acute abnormalities.

## 2011-04-04 ENCOUNTER — Other Ambulatory Visit: Payer: Self-pay | Admitting: Cardiovascular Disease

## 2011-04-04 DIAGNOSIS — N23 Unspecified renal colic: Secondary | ICD-10-CM

## 2011-04-07 ENCOUNTER — Other Ambulatory Visit: Payer: Medicaid Other

## 2011-04-15 ENCOUNTER — Other Ambulatory Visit: Payer: Self-pay

## 2011-04-15 ENCOUNTER — Observation Stay (HOSPITAL_COMMUNITY)
Admission: EM | Admit: 2011-04-15 | Discharge: 2011-04-16 | Disposition: A | Discharge status: Home / Self Care | Payer: Medicaid Other | Attending: Emergency Medicine | Admitting: Emergency Medicine

## 2011-04-15 ENCOUNTER — Encounter: Payer: Self-pay | Admitting: *Deleted

## 2011-04-15 DIAGNOSIS — Q600 Renal agenesis, unilateral: Secondary | ICD-10-CM

## 2011-04-15 DIAGNOSIS — R109 Unspecified abdominal pain: Principal | ICD-10-CM | POA: Insufficient documentation

## 2011-04-15 DIAGNOSIS — R42 Dizziness and giddiness: Secondary | ICD-10-CM | POA: Insufficient documentation

## 2011-04-15 DIAGNOSIS — R197 Diarrhea, unspecified: Secondary | ICD-10-CM

## 2011-04-15 DIAGNOSIS — Z905 Acquired absence of kidney: Secondary | ICD-10-CM | POA: Insufficient documentation

## 2011-04-15 DIAGNOSIS — I129 Hypertensive chronic kidney disease with stage 1 through stage 4 chronic kidney disease, or unspecified chronic kidney disease: Secondary | ICD-10-CM | POA: Insufficient documentation

## 2011-04-15 DIAGNOSIS — G8929 Other chronic pain: Secondary | ICD-10-CM | POA: Insufficient documentation

## 2011-04-15 DIAGNOSIS — N189 Chronic kidney disease, unspecified: Secondary | ICD-10-CM | POA: Insufficient documentation

## 2011-04-15 DIAGNOSIS — R55 Syncope and collapse: Secondary | ICD-10-CM | POA: Insufficient documentation

## 2011-04-15 DIAGNOSIS — E860 Dehydration: Secondary | ICD-10-CM

## 2011-04-15 HISTORY — DX: Essential (primary) hypertension: I10

## 2011-04-15 LAB — BASIC METABOLIC PANEL
BUN: 29 mg/dL — ABNORMAL HIGH (ref 6–23)
CO2: 22 mEq/L (ref 19–32)
Calcium: 9.5 mg/dL (ref 8.4–10.5)
Chloride: 104 mEq/L (ref 96–112)
Creatinine, Ser: 2.32 mg/dL — ABNORMAL HIGH (ref 0.50–1.35)
GFR calc Af Amer: 35 mL/min — ABNORMAL LOW (ref >90)

## 2011-04-15 LAB — URINALYSIS, ROUTINE W REFLEX MICROSCOPIC
Bilirubin Urine: NEGATIVE
Ketones, ur: NEGATIVE mg/dL
Nitrite: NEGATIVE
Protein, ur: 100 mg/dL — AB

## 2011-04-15 LAB — URINE MICROSCOPIC-ADD ON

## 2011-04-15 MED ORDER — SODIUM CHLORIDE 0.9 % IV SOLN
INTRAVENOUS | Status: DC
  Administered 2011-04-16: 02:00:00 via INTRAVENOUS
  Administered 2011-04-16: 125 mL/h via INTRAVENOUS

## 2011-04-15 MED ORDER — SODIUM CHLORIDE 0.9 % IV BOLUS (SEPSIS)
500.0000 mL | Freq: Once | INTRAVENOUS | Status: AC
  Administered 2011-04-15: 500 mL via INTRAVENOUS

## 2011-04-15 MED ORDER — ONDANSETRON HCL 4 MG/2ML IJ SOLN: 4.0000 mg | Freq: Four times a day (QID) | INTRAMUSCULAR | Status: DC | PRN

## 2011-04-15 MED ORDER — SODIUM CHLORIDE 0.9 % IV SOLN
INTRAVENOUS | Status: DC
  Administered 2011-04-15: 22:00:00 via INTRAVENOUS

## 2011-04-15 MED ORDER — ZOLPIDEM TARTRATE 5 MG PO TABS: 10.0000 mg | ORAL_TABLET | Freq: Every evening | ORAL | Status: DC | PRN

## 2011-04-15 NOTE — ED Provider Notes (Signed)
History     CSN: [id number]  Arrival date & time 04/15/11  1653   First MD Initiated Contact with Patient 04/15/11 1704      Chief Complaint  Patient presents with  . Abdominal Pain    (Consider location/radiation/quality/duration/timing/severity/associated sxs/prior treatment) HPI Comments: Patient presents with right sided flank pain.  The pain has been present for years.  He reports that the pain is worse today then usual.  He describes the pain as a constant pressure.  PMH significant for a left nephrectomy done 35 years ago.  He donated his kidney to his sister.  He has been seen in the ED and by his PCP several times for the same complaint.  He states that no one can figure out what is wrong.  He reports that at one visit a physician did a rectal exam and that took away the pain. Patient also reports that he has been feeling weak the past 2 days.  He reports that he called EMS yesterday and he states they told him nothing was wrong and he decided not to come to the hospital. He reports that he did have a syncopal episode today.  He thinks the episode may have lasted a couple of minutes.  He was feeling dizzy prior to the episode.  He reports that he is still feeling dizzy at this time.  Patient is a 50 y.o. male presenting with flank pain.  Flank Pain This is a chronic problem. Pertinent negatives include no abdominal pain, change in bowel habit, chest pain, chills, coughing, fever, headaches, nausea, neck pain, numbness, rash, urinary symptoms or vomiting. The symptoms are aggravated by nothing. He has tried nothing for the symptoms.    Past Medical History  Diagnosis Date  . Hypertension     History reviewed. No pertinent past surgical history.  History reviewed. No pertinent family history.  History  Substance Use Topics  . Smoking status: Never Smoker   . Smokeless tobacco: Not on file  . Alcohol Use: No      Review of Systems  Constitutional: Negative for fever  and chills.  HENT: Negative for neck pain and neck stiffness.   Respiratory: Negative for cough and chest tightness.   Cardiovascular: Negative for chest pain.  Gastrointestinal: Negative for nausea, vomiting, abdominal pain and change in bowel habit.  Genitourinary: Positive for flank pain. Negative for dysuria, hematuria, decreased urine volume, discharge, difficulty urinating, penile pain and testicular pain.  Musculoskeletal: Negative for gait problem.  Skin: Negative for rash.  Neurological: Positive for dizziness. Negative for light-headedness, numbness and headaches.    Allergies  Review of patient's allergies indicates no known allergies.  Home Medications   Current Outpatient Rx  Name Route Sig Dispense Refill  . ISOSORB DINITRATE-HYDRALAZINE 20-37.5 MG PO TABS Oral Take 1 tablet by mouth 3 (three) times daily.      Marland Kitchen LABETALOL HCL 200 MG PO TABS Oral Take 200 mg by mouth 2 (two) times daily.        BP 128/85  Pulse 113  Temp(Src) 98.1 F (36.7 C) (Oral)  Resp 20  SpO2 98%  Physical Exam  Nursing note and vitals reviewed. Constitutional: He is oriented to person, place, and time. He appears well-developed and well-nourished. No distress.  HENT:  Head: Normocephalic and atraumatic.  Cardiovascular: Normal rate, regular rhythm and normal heart sounds.   Pulmonary/Chest: Effort normal and breath sounds normal.  Abdominal: Soft. Bowel sounds are normal. There is CVA tenderness.  Right sided CVA tenderness.  Genitourinary: Rectum normal and prostate normal. Rectal exam shows no external hemorrhoid, no mass and no tenderness.       No fecal impaction   Musculoskeletal: Normal range of motion.  Neurological: He is alert and oriented to person, place, and time.  Skin: Skin is warm and dry. No rash noted. He is not diaphoretic.  Psychiatric: He has a normal mood and affect.    ED Course  Procedures (including critical care time)   Labs Reviewed  URINALYSIS,  ROUTINE W REFLEX MICROSCOPIC  BASIC METABOLIC PANEL   No results found.   No diagnosis found.   Date: 04/15/2011  Rate: 94  Rhythm: normal sinus rhythm  QRS Axis: normal  Intervals: normal  ST/T Wave abnormalities: normal  Conduction Disutrbances:none  Narrative Interpretation:   Old EKG Reviewed: unchanged   Labs show a creatine of 2.32.  Patient has history of chronic kidney disease.  However, this creatine is higher than usual.  Last creatine was 1.57 in May 2012.  Will hydrate patient with IVF and reassess.  Discussed with Juanita Laster, PA-C.  Will put patient on dehydration protocol and place in CDU.  Plan to recheck creatine in the morning. MDM   Flank pain chronic.  However, due to the increase in creatine will keep patient overnight on fluids and recheck creatine in the morning.       Pascal Lux Wingen 04/16/11 1610

## 2011-04-15 NOTE — ED Notes (Signed)
Patient right right sided pain.  Patient states that it has been bothering him for about two years now and his MDs told him that they could not find anything.  Patient states that yesterday it started to hurt again and he feels weak

## 2011-04-15 NOTE — ED Notes (Signed)
Pt transferred to [facility]. Pt placed back on monitor and given call bell and remote

## 2011-04-16 ENCOUNTER — Observation Stay (HOSPITAL_COMMUNITY): Payer: Medicaid Other

## 2011-04-16 LAB — BASIC METABOLIC PANEL
Calcium: 9.2 mg/dL (ref 8.4–10.5)
Chloride: 107 mEq/L (ref 96–112)
Creatinine, Ser: 1.78 mg/dL — ABNORMAL HIGH (ref 0.50–1.35)
GFR calc Af Amer: 49 mL/min — ABNORMAL LOW (ref >90)

## 2011-04-16 MED ORDER — HYDRALAZINE HCL 20 MG/ML IJ SOLN: 10.0000 mg | Freq: Once | INTRAMUSCULAR | Status: DC

## 2011-04-16 NOTE — Discharge Instructions (Signed)
You need to follow up with your primary care doctor for a recheck of your kidney function and repeat urinalysis.  Return to the ER for any problems, including fever, bloody stool or other concerns that may arise.

## 2011-04-16 NOTE — ED Provider Notes (Signed)
Medical screening examination/treatment/procedure(s) were performed by non-physician practitioner and as supervising physician I was immediately available for consultation/collaboration.   Dayton Bailiff, MD 04/16/11 [phone]

## 2011-04-16 NOTE — Progress Notes (Signed)
Observation review is complete. 

## 2011-04-16 NOTE — ED Provider Notes (Signed)
My shift in the ED started at 7AM and the patient had been seen the night before and placed in the CDU under dehydration protocol.  I was asked to see the patient this morning as there was some uncertainty as to what the actual plan for the patient was.    I reviewed the ED record and agree with the documentation as per Pascal Lux Wingen's note.  I re-evaluated the patient who was complaining of a "pressure" in the flank where he solitary kidney is located.  There was no cva ttp and the abdominal exam was unremarkable.  His creatinine had improved with hydration.  I felt it appropriate to image the abdomen with a ct to be sure there was no pathology that could threaten his solitary kidney.  This was done and showed some stranding in the perinephric fat, consistent with infection or passed stone.    I called urology to discuss these results.  They did not believe that the perinephric stranding was an emergent problem, but rather one that would need follow up with pcp for recheck of ua and renal function.  Will discharge to home and follow up with pcp next week.  Geoffery Lyons, MD 04/16/11 [phone]

## 2011-04-16 NOTE — ED Notes (Signed)
DR Judd Lien IN TO REEVALUATE PATIENT.

## 2011-06-30 ENCOUNTER — Other Ambulatory Visit: Payer: Self-pay | Admitting: Nephrology

## 2011-07-03 ENCOUNTER — Other Ambulatory Visit: Payer: Medicaid Other

## 2011-07-15 ENCOUNTER — Ambulatory Visit
Admission: RE | Admit: 2011-07-15 | Discharge: 2011-07-15 | Disposition: A | Discharge status: Home / Self Care | Payer: Medicaid Other | Source: Ambulatory Visit | Attending: Nephrology | Admitting: Nephrology

## 2012-05-12 IMAGING — CT CT ABD-PELV W/O CM
2 of 4 series · 17 of 46 positions shown, 19 images · non-contrast
Comparison: None

CLINICAL DATA: Evaluate kidney stones

CT ABDOMEN AND PELVIS WITHOUT CONTRAST
TECHNIQUE: Multidetector CT imaging of the abdomen and pelvis was
performed following the standard protocol without intravenous
contrast.

[Series 2: abd/pelv w/o 5.0 b31f st · axial · non-contrast · 0.82mm/px · z∈[-490,-35]mm · 14 of 101 slices shown, 16 images]
[im 5/101  soft-tissue]
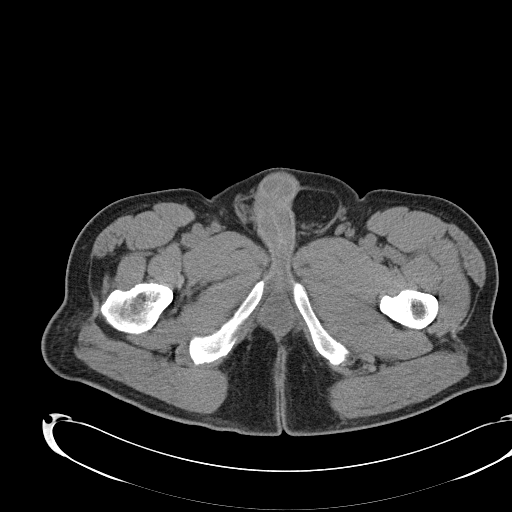
[im 5/101  bone]
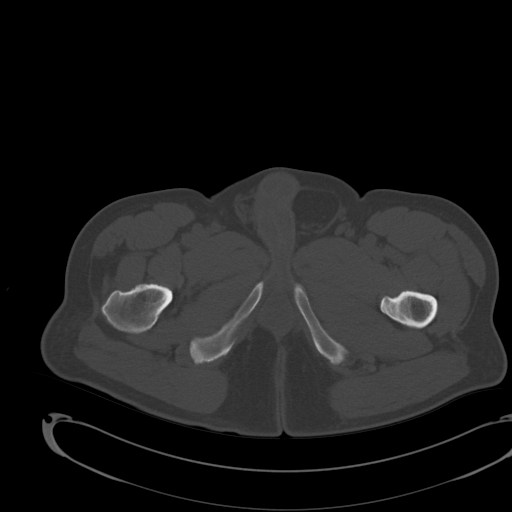
[im 13/101  soft-tissue]
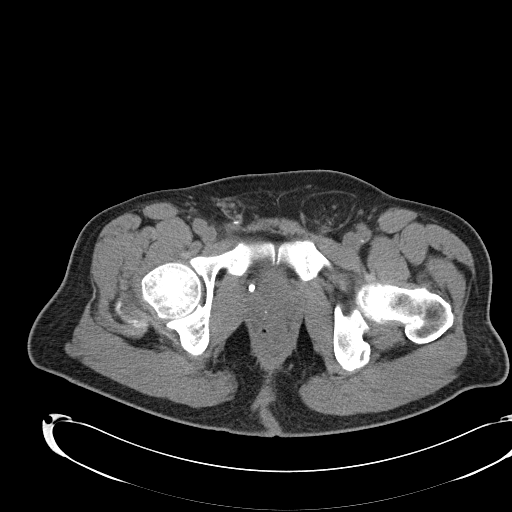
[im 21/101  soft-tissue]
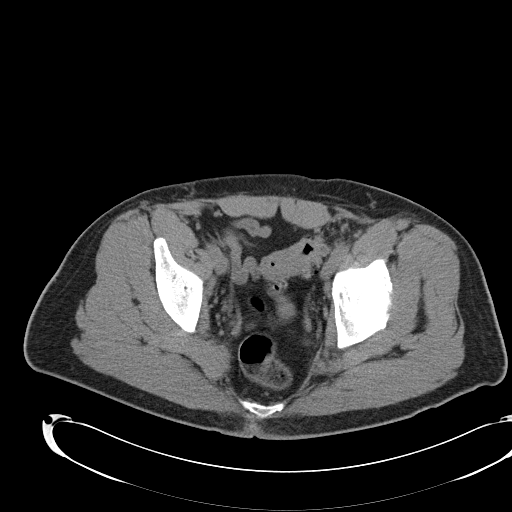
[im 26/101  soft-tissue]
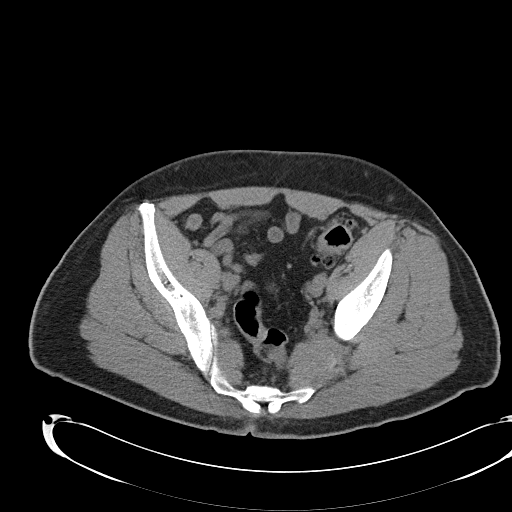
[im 34/101  soft-tissue]
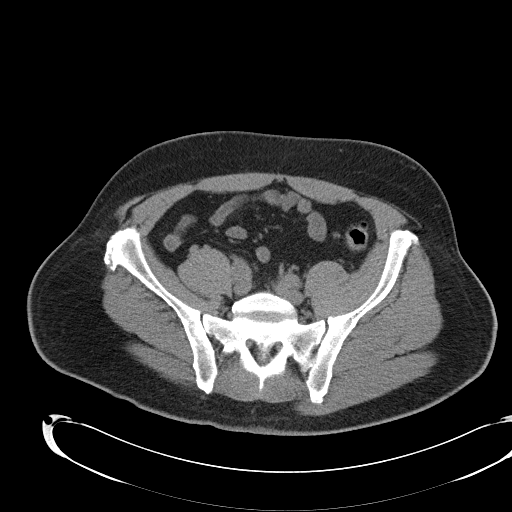
[im 42/101  soft-tissue]
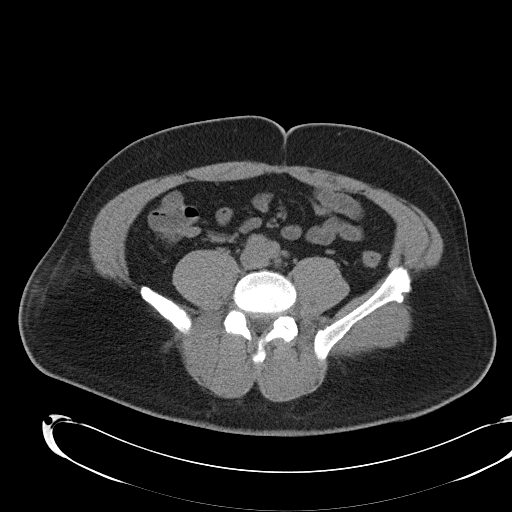
[im 46/101  soft-tissue]
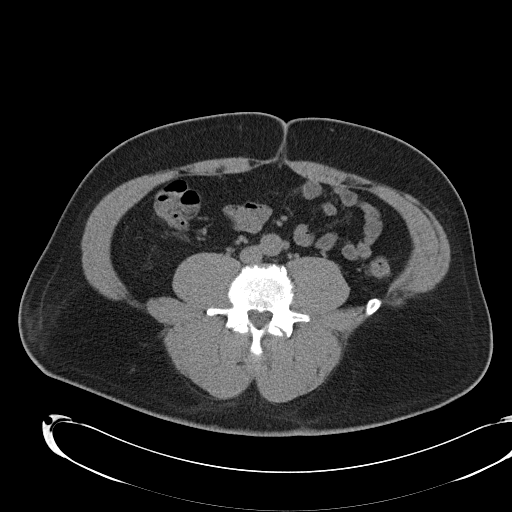
[im 55/101  soft-tissue]
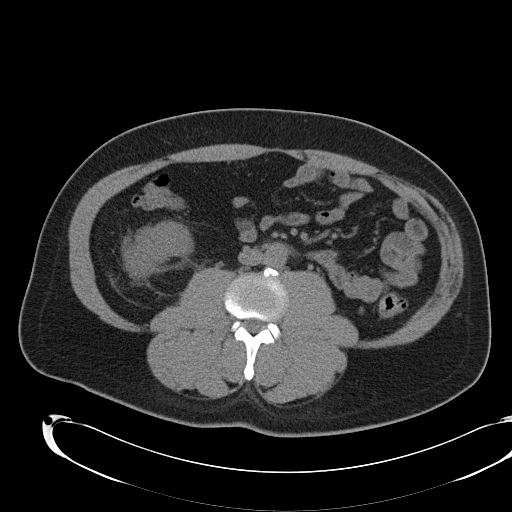
[im 59/101  soft-tissue]
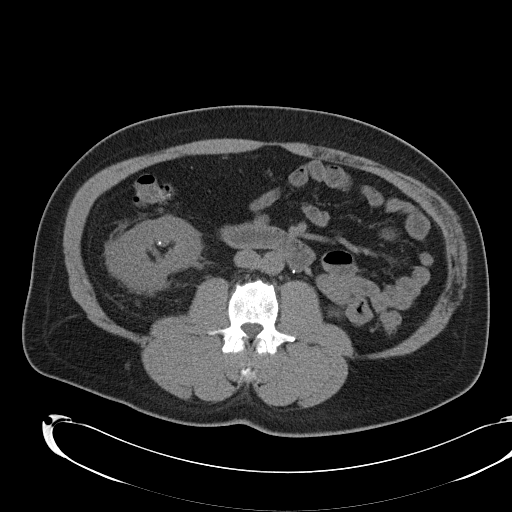
[im 59/101  bone]
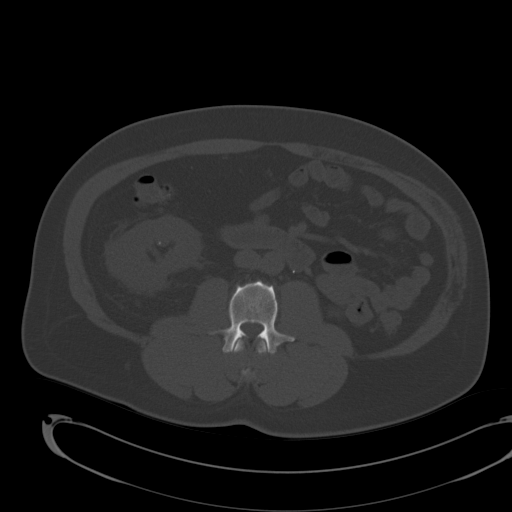
[im 67/101  soft-tissue]
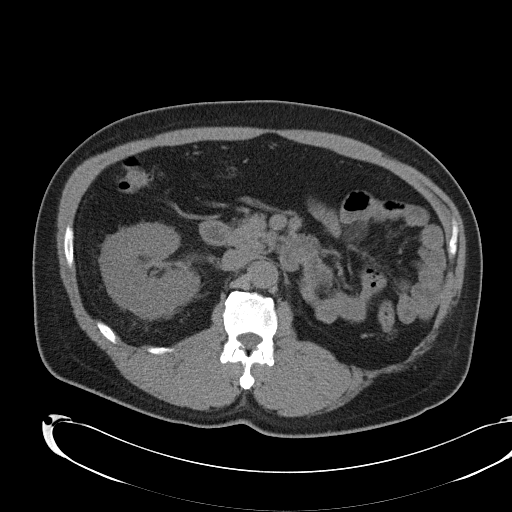
[im 76/101  soft-tissue]
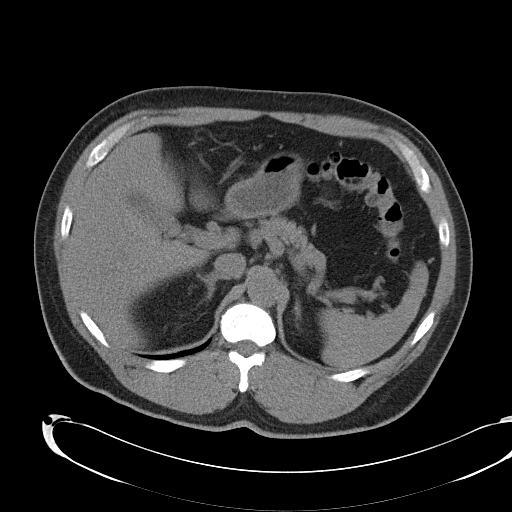
[im 80/101  soft-tissue]
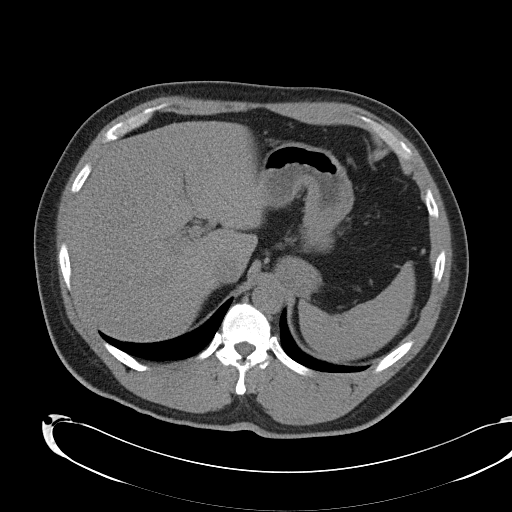
[im 88/101  soft-tissue]
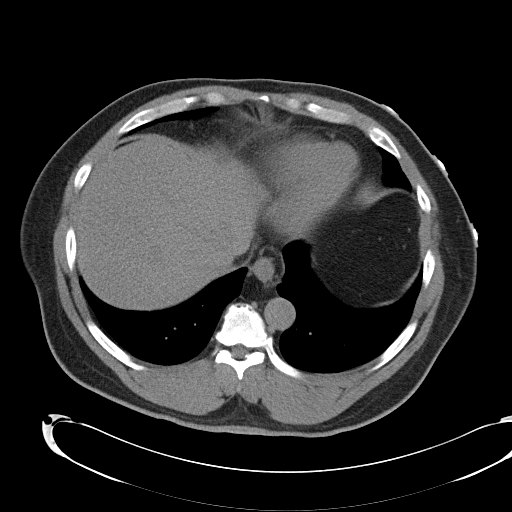
[im 96/101  soft-tissue]
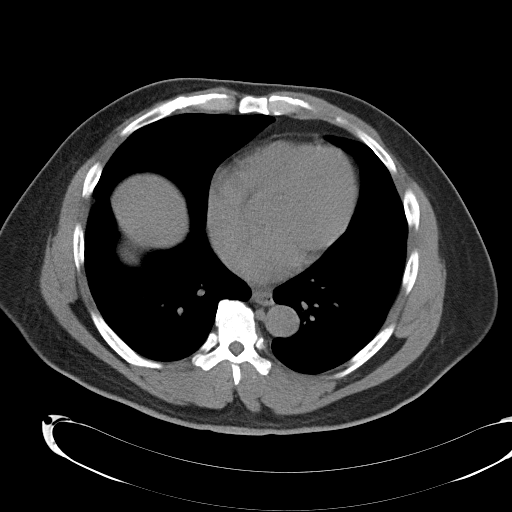

[Series 4: coronals · coronal · 1.04mm/px · 3 of 129 slices shown]
[im 43/129  soft-tissue]
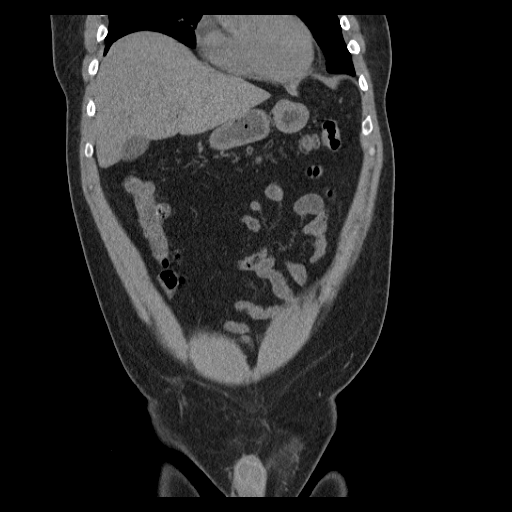
[im 57/129  soft-tissue]
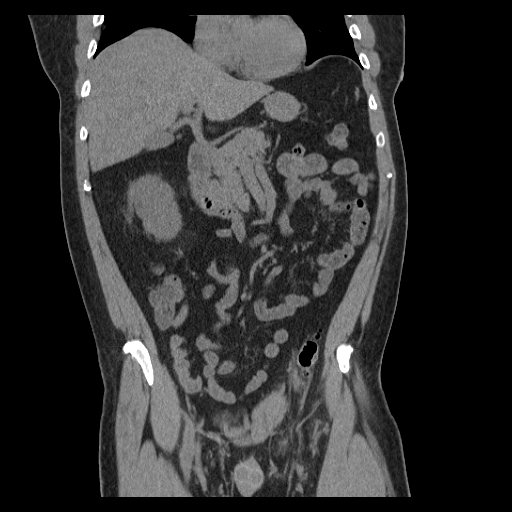
[im 72/129  soft-tissue]
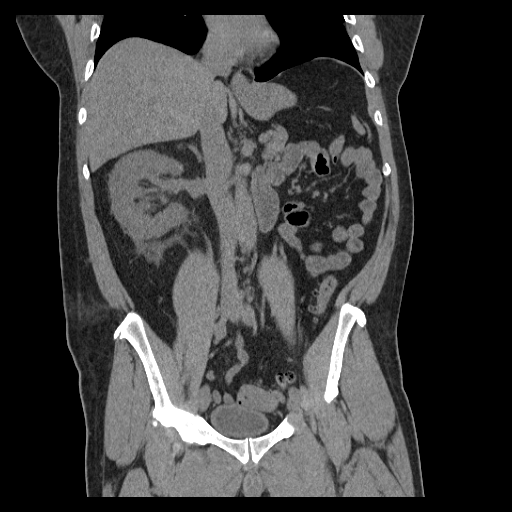

[17 of 46 positions shown; findings below may reference images not displayed]

FINDINGS: Lung bases are clear.

No pericardial or pleural effusions identified.

The spleen appears normal.

There is no focal liver abnormalities identified.

Gallbladder appears normal.  There is no biliary dilatation.

The pancreas is within normal limits.

Prior left nephrectomy.

Right-sided hydronephrosis and nephromegaly is noted.  Mild
perinephric fat stranding.

Within the inferior pole of the right kidney there is a stone
measuring 3 mm, image 43.  No right ureteral lithiasis or bladder
calculus identified.

The patient has a small hiatal hernia.  Stomach otherwise negative.

The small bowel loops are within normal limits.

Multiple sigmoid diverticula noted without acute inflammation.

There is a left inguinal hernia which contains fat only.

Review of the visualized osseous structures is significant for
lumbar degenerative disc disease.
IMPRESSION: 1.  Right-sided nephromegaly, perinephric fat stranding and mild
hydronephrosis is noted.  No evidence for ureterolithiasis.
Findings likely represent recently passed kidney stone or
infection.
2. Nonobstructing calculus within the inferior pole calix of the
right kidney.

## 2012-10-21 ENCOUNTER — Encounter (HOSPITAL_COMMUNITY): Payer: Self-pay | Admitting: Emergency Medicine

## 2012-10-21 ENCOUNTER — Emergency Department (HOSPITAL_COMMUNITY)
Admission: EM | Admit: 2012-10-21 | Discharge: 2012-10-21 | Disposition: A | Discharge status: Home / Self Care | Payer: Medicaid Other | Attending: Emergency Medicine | Admitting: Emergency Medicine

## 2012-10-21 ENCOUNTER — Emergency Department (HOSPITAL_COMMUNITY): Payer: Medicaid Other

## 2012-10-21 DIAGNOSIS — R42 Dizziness and giddiness: Secondary | ICD-10-CM | POA: Insufficient documentation

## 2012-10-21 DIAGNOSIS — Z79899 Other long term (current) drug therapy: Secondary | ICD-10-CM | POA: Insufficient documentation

## 2012-10-21 DIAGNOSIS — I10 Essential (primary) hypertension: Secondary | ICD-10-CM | POA: Insufficient documentation

## 2012-10-21 DIAGNOSIS — R0789 Other chest pain: Secondary | ICD-10-CM | POA: Insufficient documentation

## 2012-10-21 DIAGNOSIS — R55 Syncope and collapse: Secondary | ICD-10-CM | POA: Insufficient documentation

## 2012-10-21 DIAGNOSIS — R5381 Other malaise: Secondary | ICD-10-CM | POA: Insufficient documentation

## 2012-10-21 DIAGNOSIS — R0602 Shortness of breath: Secondary | ICD-10-CM | POA: Insufficient documentation

## 2012-10-21 LAB — CBC WITH DIFFERENTIAL/PLATELET
Basophils Absolute: 0 10*3/uL (ref 0.0–0.1)
Lymphocytes Relative: 9 % — ABNORMAL LOW (ref 12–46)
Neutro Abs: 8.6 10*3/uL — ABNORMAL HIGH (ref 1.7–7.7)
Platelets: 205 10*3/uL (ref 150–400)
RDW: 13.1 % (ref 11.5–15.5)
WBC: 10.5 10*3/uL (ref 4.0–10.5)

## 2012-10-21 LAB — COMPREHENSIVE METABOLIC PANEL
ALT: 7 U/L (ref 0–53)
AST: 15 U/L (ref 0–37)
CO2: 26 mEq/L (ref 19–32)
Calcium: 8.9 mg/dL (ref 8.4–10.5)
Chloride: 105 mEq/L (ref 96–112)
GFR calc non Af Amer: 46 mL/min — ABNORMAL LOW (ref >90)
Sodium: 140 mEq/L (ref 135–145)
Total Bilirubin: 0.2 mg/dL — ABNORMAL LOW (ref 0.3–1.2)

## 2012-10-21 LAB — URINALYSIS, ROUTINE W REFLEX MICROSCOPIC
Glucose, UA: NEGATIVE mg/dL
Ketones, ur: NEGATIVE mg/dL
pH: 6.5 (ref 5.0–8.0)

## 2012-10-21 LAB — URINE MICROSCOPIC-ADD ON

## 2012-10-21 NOTE — ED Notes (Signed)
Pt here from MD office after a near syncopal episode and chest pain . Pt states pain is a 3 out of 10 , 324mg  asa and 2 nitro given by ems

## 2012-10-21 NOTE — ED Provider Notes (Signed)
History    CSN: [id number] Arrival date & time 10/21/12  1059  First MD Initiated Contact with Patient 10/21/12 1106     Chief Complaint  Patient presents with  . Near Syncope  . Chest Pain   (Consider location/radiation/quality/duration/timing/severity/associated sxs/prior Treatment) HPI Comments: Patient sent to the ER for evaluation from a doctor's office. Patient reports that he awoke this morning feeling very weak. He became dizzy, developed a headache and nearly passed out waiting at the doctor's office. Patient reports that he has a tightness in his chest, 3/10. Patient comes to the ER by ambulance. He was given aspirin and nitroglycerin, no significant relief. Patient reports that he is slightly short of breath. He has not had any cough or fever.  Patient is a 53 y.o. male presenting with chest pain.  Chest Pain Associated symptoms: dizziness, shortness of breath and weakness    Past Medical History  Diagnosis Date  . Hypertension    History reviewed. No pertinent past surgical history. No family history on file. History  Substance Use Topics  . Smoking status: Never Smoker   . Smokeless tobacco: Not on file  . Alcohol Use: No    Review of Systems  Respiratory: Positive for shortness of breath.   Cardiovascular: Positive for chest pain.  Neurological: Positive for dizziness and weakness.  All other systems reviewed and are negative.    Allergies  Review of patient's allergies indicates no known allergies.  Home Medications   Current Outpatient Rx  Name  Route  Sig  Dispense  Refill  . isosorbide-hydrALAZINE (BIDIL) 20-37.5 MG per tablet   Oral   Take 1 tablet by mouth 3 (three) times daily.           Marland Kitchen labetalol (NORMODYNE) 200 MG tablet   Oral   Take 200 mg by mouth 2 (two) times daily.            BP 123/71  Pulse 91  Temp(Src) 97.6 F (36.4 C) (Oral)  Resp 24  SpO2 98% Physical Exam  Constitutional: He is oriented to person, place, and  time. He appears well-developed and well-nourished. No distress.  HENT:  Head: Normocephalic and atraumatic.  Right Ear: Hearing normal.  Left Ear: Hearing normal.  Nose: Nose normal.  Mouth/Throat: Oropharynx is clear and moist and mucous membranes are normal.  Eyes: Conjunctivae and EOM are normal. Pupils are equal, round, and reactive to light.  Neck: Normal range of motion. Neck supple.  Cardiovascular: Regular rhythm, S1 normal and S2 normal.  Exam reveals no gallop and no friction rub.   No murmur heard. Pulmonary/Chest: Effort normal and breath sounds normal. No respiratory distress. He exhibits no tenderness.  Abdominal: Soft. Normal appearance and bowel sounds are normal. There is no hepatosplenomegaly. There is no tenderness. There is no rebound, no guarding, no tenderness at McBurney's point and negative Murphy's sign. No hernia.  Musculoskeletal: Normal range of motion.  Neurological: He is alert and oriented to person, place, and time. He has normal strength. No cranial nerve deficit or sensory deficit. Coordination normal. GCS eye subscore is 4. GCS verbal subscore is 5. GCS motor subscore is 6.  Skin: Skin is warm, dry and intact. No rash noted. No cyanosis.  Psychiatric: He has a normal mood and affect. His speech is normal and behavior is normal. Thought content normal.    ED Course  Procedures (including critical care time)  EKG:  Date: 10/21/2012  Rate: 85  Rhythm: normal sinus rhythm  QRS Axis: normal  Intervals: normal  ST/T Wave abnormalities: normal  Conduction Disutrbances:none  Narrative Interpretation:   Old EKG Reviewed: none available    Labs Reviewed  CBC WITH DIFFERENTIAL - Abnormal; Notable for the following:    RBC 3.88 (*)    Hemoglobin 12.2 (*)    HCT 35.4 (*)    Neutrophils Relative % 82 (*)    Neutro Abs 8.6 (*)    Lymphocytes Relative 9 (*)    All other components within normal limits  COMPREHENSIVE METABOLIC PANEL - Abnormal; Notable  for the following:    Glucose, Bld 136 (*)    Creatinine, Ser 1.64 (*)    Albumin 3.4 (*)    Total Bilirubin 0.2 (*)    GFR calc non Af Amer 46 (*)    GFR calc Af Amer 53 (*)    All other components within normal limits  URINALYSIS, ROUTINE W REFLEX MICROSCOPIC - Abnormal; Notable for the following:    Protein, ur 100 (*)    Leukocytes, UA SMALL (*)    All other components within normal limits  URINE MICROSCOPIC-ADD ON - Abnormal; Notable for the following:    Casts HYALINE CASTS (*)    All other components within normal limits  PRO B NATRIURETIC PEPTIDE  TROPONIN I   Ct Head Wo Contrast  10/21/2012   *RADIOLOGY REPORT*  Clinical Data: Headache and dizziness.  Near-syncopal episode today.  CT HEAD WITHOUT CONTRAST  Technique:  Contiguous axial images were obtained from the base of the skull through the vertex without contrast.  Comparison: 11/09/2009  Findings: No acute intracranial hemorrhage, infarction, or mass lesion.  Progressive encephalomalacia at the site of an old left posterior cerebral artery infarct.  Brain parenchyma is otherwise normal.  Osseous structures are normal.  IMPRESSION: No acute abnormality.  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   Original Report Authenticated By: Francene Boyers, M.D.   Diagnosis: Near syncope  MDM  She comes to the ER for evaluation of near syncope. Patient was at his doctor's office when this occurred. Patient was hypotensive and responded to IV fluids. He did have slight, vague chest pain complaints but cardiac workup has been negative. His chest pain resolved here in the ER around the time he arrived and he has not had any further chest pain. Patient is appropriate for discharge with followup at the primary care doctor. Return if he has any recurrent chest pains.   Gilda Crease, MD 10/22/12 [phone]

## 2012-10-21 NOTE — ED Notes (Signed)
Pt reported he did not eat breakfast before the Dr's appt. . While at Dr's office Pt felt weak and was sent the ED vis EMS.

## 2016-08-04 ENCOUNTER — Inpatient Hospital Stay (HOSPITAL_COMMUNITY)
Admission: EM | Admit: 2016-08-04 | Discharge: 2016-08-08 | DRG: 065 | Disposition: A | Discharge status: Home Health | Payer: Medicaid Other | Attending: Nephrology | Admitting: Nephrology

## 2016-08-04 ENCOUNTER — Emergency Department (HOSPITAL_COMMUNITY): Payer: Medicaid Other

## 2016-08-04 ENCOUNTER — Encounter (HOSPITAL_COMMUNITY): Payer: Self-pay | Admitting: Pharmacy Technician

## 2016-08-04 DIAGNOSIS — I639 Cerebral infarction, unspecified: Secondary | ICD-10-CM

## 2016-08-04 DIAGNOSIS — E162 Hypoglycemia, unspecified: Secondary | ICD-10-CM | POA: Diagnosis present

## 2016-08-04 DIAGNOSIS — E876 Hypokalemia: Secondary | ICD-10-CM | POA: Diagnosis present

## 2016-08-04 DIAGNOSIS — R4182 Altered mental status, unspecified: Secondary | ICD-10-CM

## 2016-08-04 DIAGNOSIS — Z833 Family history of diabetes mellitus: Secondary | ICD-10-CM

## 2016-08-04 DIAGNOSIS — I6340 Cerebral infarction due to embolism of unspecified cerebral artery: Principal | ICD-10-CM | POA: Diagnosis present

## 2016-08-04 DIAGNOSIS — D509 Iron deficiency anemia, unspecified: Secondary | ICD-10-CM | POA: Diagnosis present

## 2016-08-04 DIAGNOSIS — D631 Anemia in chronic kidney disease: Secondary | ICD-10-CM | POA: Diagnosis present

## 2016-08-04 DIAGNOSIS — Z9114 Patient's other noncompliance with medication regimen: Secondary | ICD-10-CM

## 2016-08-04 DIAGNOSIS — Z91199 Patient's noncompliance with other medical treatment and regimen due to unspecified reason: Secondary | ICD-10-CM

## 2016-08-04 DIAGNOSIS — F17290 Nicotine dependence, other tobacco product, uncomplicated: Secondary | ICD-10-CM | POA: Diagnosis present

## 2016-08-04 DIAGNOSIS — Z8249 Family history of ischemic heart disease and other diseases of the circulatory system: Secondary | ICD-10-CM

## 2016-08-04 DIAGNOSIS — N183 Chronic kidney disease, stage 3 unspecified: Secondary | ICD-10-CM

## 2016-08-04 DIAGNOSIS — N184 Chronic kidney disease, stage 4 (severe): Secondary | ICD-10-CM

## 2016-08-04 DIAGNOSIS — G8191 Hemiplegia, unspecified affecting right dominant side: Secondary | ICD-10-CM | POA: Diagnosis present

## 2016-08-04 DIAGNOSIS — Z8673 Personal history of transient ischemic attack (TIA), and cerebral infarction without residual deficits: Secondary | ICD-10-CM

## 2016-08-04 DIAGNOSIS — D649 Anemia, unspecified: Secondary | ICD-10-CM

## 2016-08-04 DIAGNOSIS — I161 Hypertensive emergency: Secondary | ICD-10-CM | POA: Diagnosis present

## 2016-08-04 DIAGNOSIS — I129 Hypertensive chronic kidney disease with stage 1 through stage 4 chronic kidney disease, or unspecified chronic kidney disease: Secondary | ICD-10-CM | POA: Diagnosis present

## 2016-08-04 DIAGNOSIS — Z9119 Patient's noncompliance with other medical treatment and regimen: Secondary | ICD-10-CM

## 2016-08-04 DIAGNOSIS — R531 Weakness: Secondary | ICD-10-CM

## 2016-08-04 DIAGNOSIS — I10 Essential (primary) hypertension: Secondary | ICD-10-CM

## 2016-08-04 DIAGNOSIS — N179 Acute kidney failure, unspecified: Secondary | ICD-10-CM | POA: Diagnosis present

## 2016-08-04 DIAGNOSIS — E871 Hypo-osmolality and hyponatremia: Secondary | ICD-10-CM | POA: Diagnosis present

## 2016-08-04 DIAGNOSIS — E785 Hyperlipidemia, unspecified: Secondary | ICD-10-CM | POA: Diagnosis present

## 2016-08-04 HISTORY — DX: Cerebral infarction, unspecified: I63.9

## 2016-08-04 HISTORY — DX: Chronic kidney disease, unspecified: N18.9

## 2016-08-04 HISTORY — DX: Hypertensive retinopathy, bilateral: H35.033

## 2016-08-04 LAB — CBC WITH DIFFERENTIAL/PLATELET
BASOS ABS: 0 10*3/uL (ref 0.0–0.1)
Basophils Relative: 0 %
EOS PCT: 1 %
Eosinophils Absolute: 0.1 10*3/uL (ref 0.0–0.7)
HEMATOCRIT: 30.8 % — AB (ref 39.0–52.0)
Hemoglobin: 10.3 g/dL — ABNORMAL LOW (ref 13.0–17.0)
LYMPHS ABS: 1 10*3/uL (ref 0.7–4.0)
Lymphocytes Relative: 9 %
MCH: 28.1 pg (ref 26.0–34.0)
MCHC: 33.4 g/dL (ref 30.0–36.0)
MCV: 83.9 fL (ref 78.0–100.0)
MONO ABS: 0.4 10*3/uL (ref 0.1–1.0)
MONOS PCT: 3 %
Neutro Abs: 10.1 10*3/uL — ABNORMAL HIGH (ref 1.7–7.7)
Neutrophils Relative %: 87 %
PLATELETS: 201 10*3/uL (ref 150–400)
RBC: 3.67 MIL/uL — ABNORMAL LOW (ref 4.22–5.81)
RDW: 14 % (ref 11.5–15.5)
WBC: 11.6 10*3/uL — ABNORMAL HIGH (ref 4.0–10.5)

## 2016-08-04 LAB — URINALYSIS, ROUTINE W REFLEX MICROSCOPIC
BACTERIA UA: NONE SEEN
Bilirubin Urine: NEGATIVE
Glucose, UA: 150 mg/dL — AB
Ketones, ur: NEGATIVE mg/dL
Leukocytes, UA: NEGATIVE
Nitrite: NEGATIVE
PH: 7 (ref 5.0–8.0)
Protein, ur: 100 mg/dL — AB
SPECIFIC GRAVITY, URINE: 1.01 (ref 1.005–1.030)
SQUAMOUS EPITHELIAL / LPF: NONE SEEN

## 2016-08-04 LAB — RAPID URINE DRUG SCREEN, HOSP PERFORMED
Amphetamines: NOT DETECTED
BENZODIAZEPINES: NOT DETECTED
Barbiturates: NOT DETECTED
COCAINE: NOT DETECTED
OPIATES: NOT DETECTED
TETRAHYDROCANNABINOL: NOT DETECTED

## 2016-08-04 LAB — BASIC METABOLIC PANEL
Anion gap: 12 (ref 5–15)
BUN: 22 mg/dL — AB (ref 6–20)
CO2: 22 mmol/L (ref 22–32)
CREATININE: 3.07 mg/dL — AB (ref 0.61–1.24)
Calcium: 8.4 mg/dL — ABNORMAL LOW (ref 8.9–10.3)
Chloride: 100 mmol/L — ABNORMAL LOW (ref 101–111)
GFR calc Af Amer: 24 mL/min — ABNORMAL LOW (ref >60)
GFR calc non Af Amer: 21 mL/min — ABNORMAL LOW (ref >60)
GLUCOSE: 202 mg/dL — AB (ref 65–99)
Potassium: 3 mmol/L — ABNORMAL LOW (ref 3.5–5.1)
Sodium: 134 mmol/L — ABNORMAL LOW (ref 135–145)

## 2016-08-04 LAB — CBG MONITORING, ED: GLUCOSE-CAPILLARY: 167 mg/dL — AB (ref 65–99)

## 2016-08-04 MED ORDER — HYDRALAZINE HCL 20 MG/ML IJ SOLN
10.0000 mg | Freq: Once | INTRAMUSCULAR | Status: AC
  Administered 2016-08-04: 10 mg via INTRAVENOUS
  Filled 2016-08-04: qty 1

## 2016-08-04 MED ORDER — ONDANSETRON HCL 4 MG/2ML IJ SOLN
4.0000 mg | Freq: Once | INTRAMUSCULAR | Status: AC
  Administered 2016-08-04: 4 mg via INTRAVENOUS
  Filled 2016-08-04: qty 2

## 2016-08-04 MED ORDER — SODIUM CHLORIDE 0.9 % IV BOLUS (SEPSIS)
1000.0000 mL | Freq: Once | INTRAVENOUS | Status: AC
  Administered 2016-08-04: 1000 mL via INTRAVENOUS

## 2016-08-04 NOTE — ED Provider Notes (Signed)
[facility]-EMERGENCY DEPT Provider Note   CSN: [id number] Arrival date & time: 08/04/16  2109     History   Chief Complaint Chief Complaint  Patient presents with  . Emesis  . Weakness    HPI Matthew Guerra is a 56 y.o. male.   Emesis    Weakness  Associated symptoms include vomiting.   Initially unable to give history secondary to mental status change. Patient stated he was here for just overall weakness. However after blood pressure medication and improvement in his blood pressure patient stated he had a has blood pressure medicine since he didn't know when. He states that he's been having vomiting and weakness today. No headaches or blurry vision. No decreased urination. No chest pain or back pain. No other associated symptoms. No other modifying symptoms. No history of the same.  Past Medical History:  Diagnosis Date  . Hypertension   . Stroke [facility])     Patient Active Problem List   Diagnosis Date Noted  . Hypertensive emergency 08/04/2016    History reviewed. No pertinent surgical history.     Home Medications    Prior to Admission medications   Not on File    Family History No family history on file.  Social History Social History  Substance Use Topics  . Smoking status: Never Smoker  . Smokeless tobacco: Never Used  . Alcohol use No     Allergies   Patient has no known allergies.   Review of Systems Review of Systems  Gastrointestinal: Positive for vomiting.  Neurological: Positive for weakness.  All other systems reviewed and are negative.    Physical Exam Updated Vital Signs BP (!) 168/111   Pulse 76   Temp 97.5 F (36.4 C) (Oral)   Resp 13   SpO2 98%   Physical Exam  Constitutional: He appears well-developed and well-nourished.  HENT:  Head: Normocephalic and atraumatic.  Eyes: Conjunctivae and EOM are normal.  Neck: Normal range of motion.  Cardiovascular: Normal rate.   Pulmonary/Chest: Effort normal. No respiratory  distress.  Abdominal: Soft. He exhibits no distension.  Musculoskeletal: Normal range of motion. He exhibits no edema or deformity.  Neurological: He is alert. He displays normal reflexes. No cranial nerve deficit or sensory deficit. He exhibits normal muscle tone. Coordination normal.  Skin: Skin is warm and dry.  Nursing note and vitals reviewed.    ED Treatments / Results  Labs (all labs ordered are listed, but only abnormal results are displayed) Labs Reviewed  BASIC METABOLIC PANEL - Abnormal; Notable for the following:       Result Value   Sodium 134 (*)    Potassium 3.0 (*)    Chloride 100 (*)    Glucose, Bld 202 (*)    BUN 22 (*)    Creatinine, Ser 3.07 (*)    Calcium 8.4 (*)    GFR calc non Af Amer 21 (*)    GFR calc Af Amer 24 (*)    All other components within normal limits  CBC WITH DIFFERENTIAL/PLATELET - Abnormal; Notable for the following:    WBC 11.6 (*)    RBC 3.67 (*)    Hemoglobin 10.3 (*)    HCT 30.8 (*)    Neutro Abs 10.1 (*)    All other components within normal limits  URINALYSIS, ROUTINE W REFLEX MICROSCOPIC - Abnormal; Notable for the following:    Color, Urine STRAW (*)    Glucose, UA 150 (*)    Hgb urine dipstick SMALL (*)  Protein, ur 100 (*)    All other components within normal limits  CBG MONITORING, ED - Abnormal; Notable for the following:    Glucose-Capillary 167 (*)    All other components within normal limits  RAPID URINE DRUG SCREEN, HOSP PERFORMED    EKG  EKG Interpretation  Date/Time:  Monday August 04 2016 21:23:13 EDT Ventricular Rate:  70 PR Interval:    QRS Duration: 109 QT Interval:  428 QTC Calculation: 462 R Axis:   55 Text Interpretation:  Sinus rhythm Left atrial enlargement Borderline T wave abnormalities Confirmed by [facility] MD, Barbara Cower [phone]) on 08/04/2016 9:53:36 PM       Radiology Dg Chest 2 View  Result Date: 08/04/2016 CLINICAL DATA:  Assess for hemorrhage. History of hypertension and stroke. EXAM:  CHEST  2 VIEW COMPARISON:  Chest radiograph November 09, 2009 FINDINGS: Cardiomediastinal silhouette is normal. No pleural effusions or focal consolidations. Trachea projects midline and there is no pneumothorax. Soft tissue planes and included osseous structures are non-suspicious. Surgical clips project in the upper abdomen. IMPRESSION: Stable examination:  No acute cardiopulmonary process. Electronically Signed   By: Awilda Metro M.D.   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 COMPARISON:  10/21/2012 FINDINGS: Brain: Old encephalomalacia of the left occipital lobe, posterior thalamus and posterior temporal lobes with ex vacuo dilatation of the left lateral ventricle. Old appearing lacunar infarct in pons but new since 2014. New but nonacute appearing lacunar infarcts within the bilateral basal ganglia. Extensive hypodensity is now visualized within the bilateral periventricular and subcortical white matter. Probable old lacunar infarcts within the anterior white matter. No hemorrhage. No mass. No midline shift. Slight interval enlargement of the ventricles since the prior exam. Vascular: No hyperdense vessels. Scattered calcifications at the carotid siphons. Skull: No fracture or suspicious bone lesion Sinuses/Orbits: Mild mucosal thickening in the ethmoid sinuses. No acute orbital abnormality. Right lens extraction Other: None IMPRESSION: 1. No hemorrhage or mass lesion is visualized. 2. Old left posterior infarct with ex vacuo dilatation of the left lateral ventricle, slightly progressed since 2014. 3. Old appearing multifocal lacunar infarcts within the bilateral basal ganglia and pons, but new since 2014 4. Extensive hypodensity within the periventricular and  subcortical white matter presumably due to progression of small vessel ischemic changes. This could potentially obscure acute edema ; MRI follow-up may be performed as clinically indicated. Electronically Signed   By: Jasmine Pang M.D.   On: 08/04/2016 22:30    Procedures Procedures (including critical care time)  Medications Ordered in ED Medications  ondansetron [facility]) injection 4 mg (4 mg Intravenous Given 08/04/16 2133)  sodium chloride 0.9 % bolus 1,000 mL (1,000 mLs Intravenous New Bag/Given 08/04/16 2134)  hydrALAZINE (APRESOLINE) injection 10 mg (10 mg Intravenous Given 08/04/16 2254)     Initial Impression / Assessment and Plan / ED Course  I have reviewed the triage vital signs and the nursing notes.  Pertinent labs & imaging results that were available during my care of the patient were reviewed by me and considered in my medical decision making (see chart for details).   Suspect hypertensive urgency/emergency as when his bp improved so did his MS. Also with some acute on chronic renal disease, fluids given. Rest of workup relatively unremarkable. Discussed wirh  medicinr regarding admission.   Final Clinical Impressions(s) / ED Diagnoses   Final diagnoses:  Weakness  Altered mental status, unspecified altered mental status type  Accelerated hypertension      Marily Memos, MD 08/05/16 0011

## 2016-08-04 NOTE — ED Triage Notes (Signed)
Pt from home via ems with reports of weakness and vomiting starting approx 1 hour prior to arrival. Weakness is generalized. Pt hypertensive with ems with systolic pressures above 220. Pt with hx of htn and stroke, and per ems is non-compliant with medication regimen.

## 2016-08-05 ENCOUNTER — Observation Stay (HOSPITAL_COMMUNITY): Payer: Medicaid Other

## 2016-08-05 DIAGNOSIS — Z9119 Patient's noncompliance with other medical treatment and regimen: Secondary | ICD-10-CM | POA: Diagnosis not present

## 2016-08-05 DIAGNOSIS — H53461 Homonymous bilateral field defects, right side: Secondary | ICD-10-CM | POA: Diagnosis not present

## 2016-08-05 DIAGNOSIS — Z833 Family history of diabetes mellitus: Secondary | ICD-10-CM | POA: Diagnosis not present

## 2016-08-05 DIAGNOSIS — N179 Acute kidney failure, unspecified: Secondary | ICD-10-CM | POA: Diagnosis present

## 2016-08-05 DIAGNOSIS — I674 Hypertensive encephalopathy: Secondary | ICD-10-CM | POA: Diagnosis not present

## 2016-08-05 DIAGNOSIS — I6340 Cerebral infarction due to embolism of unspecified cerebral artery: Secondary | ICD-10-CM | POA: Diagnosis present

## 2016-08-05 DIAGNOSIS — E871 Hypo-osmolality and hyponatremia: Secondary | ICD-10-CM | POA: Diagnosis present

## 2016-08-05 DIAGNOSIS — D649 Anemia, unspecified: Secondary | ICD-10-CM

## 2016-08-05 DIAGNOSIS — Z8249 Family history of ischemic heart disease and other diseases of the circulatory system: Secondary | ICD-10-CM | POA: Diagnosis not present

## 2016-08-05 DIAGNOSIS — I639 Cerebral infarction, unspecified: Secondary | ICD-10-CM

## 2016-08-05 DIAGNOSIS — R202 Paresthesia of skin: Secondary | ICD-10-CM | POA: Diagnosis not present

## 2016-08-05 DIAGNOSIS — I6310 Cerebral infarction due to embolism of unspecified precerebral artery: Secondary | ICD-10-CM | POA: Diagnosis not present

## 2016-08-05 DIAGNOSIS — Z9114 Patient's other noncompliance with medication regimen: Secondary | ICD-10-CM | POA: Diagnosis not present

## 2016-08-05 DIAGNOSIS — N183 Chronic kidney disease, stage 3 unspecified: Secondary | ICD-10-CM

## 2016-08-05 DIAGNOSIS — R531 Weakness: Secondary | ICD-10-CM | POA: Diagnosis not present

## 2016-08-05 DIAGNOSIS — I161 Hypertensive emergency: Secondary | ICD-10-CM | POA: Diagnosis present

## 2016-08-05 DIAGNOSIS — I129 Hypertensive chronic kidney disease with stage 1 through stage 4 chronic kidney disease, or unspecified chronic kidney disease: Secondary | ICD-10-CM | POA: Diagnosis present

## 2016-08-05 DIAGNOSIS — N184 Chronic kidney disease, stage 4 (severe): Secondary | ICD-10-CM | POA: Diagnosis present

## 2016-08-05 DIAGNOSIS — I10 Essential (primary) hypertension: Secondary | ICD-10-CM | POA: Diagnosis not present

## 2016-08-05 DIAGNOSIS — E162 Hypoglycemia, unspecified: Secondary | ICD-10-CM | POA: Diagnosis present

## 2016-08-05 DIAGNOSIS — D631 Anemia in chronic kidney disease: Secondary | ICD-10-CM | POA: Diagnosis present

## 2016-08-05 DIAGNOSIS — F17290 Nicotine dependence, other tobacco product, uncomplicated: Secondary | ICD-10-CM | POA: Diagnosis present

## 2016-08-05 DIAGNOSIS — E785 Hyperlipidemia, unspecified: Secondary | ICD-10-CM | POA: Diagnosis present

## 2016-08-05 DIAGNOSIS — Z8673 Personal history of transient ischemic attack (TIA), and cerebral infarction without residual deficits: Secondary | ICD-10-CM | POA: Diagnosis not present

## 2016-08-05 DIAGNOSIS — G8191 Hemiplegia, unspecified affecting right dominant side: Secondary | ICD-10-CM | POA: Diagnosis present

## 2016-08-05 DIAGNOSIS — E876 Hypokalemia: Secondary | ICD-10-CM | POA: Diagnosis present

## 2016-08-05 DIAGNOSIS — D509 Iron deficiency anemia, unspecified: Secondary | ICD-10-CM | POA: Diagnosis present

## 2016-08-05 DIAGNOSIS — Z91199 Patient's noncompliance with other medical treatment and regimen due to unspecified reason: Secondary | ICD-10-CM

## 2016-08-05 LAB — BASIC METABOLIC PANEL
ANION GAP: 13 (ref 5–15)
BUN: 22 mg/dL — ABNORMAL HIGH (ref 6–20)
CO2: 24 mmol/L (ref 22–32)
Calcium: 8.8 mg/dL — ABNORMAL LOW (ref 8.9–10.3)
Chloride: 100 mmol/L — ABNORMAL LOW (ref 101–111)
Creatinine, Ser: 2.9 mg/dL — ABNORMAL HIGH (ref 0.61–1.24)
GFR calc Af Amer: 26 mL/min — ABNORMAL LOW (ref >60)
GFR, EST NON AFRICAN AMERICAN: 22 mL/min — AB (ref >60)
GLUCOSE: 138 mg/dL — AB (ref 65–99)
POTASSIUM: 3.1 mmol/L — AB (ref 3.5–5.1)
Sodium: 137 mmol/L (ref 135–145)

## 2016-08-05 LAB — RETICULOCYTES
RBC.: 3.72 MIL/uL — ABNORMAL LOW (ref 4.22–5.81)
Retic Count, Absolute: 52.1 10*3/uL (ref 19.0–186.0)
Retic Ct Pct: 1.4 % (ref 0.4–3.1)

## 2016-08-05 LAB — HIV ANTIBODY (ROUTINE TESTING W REFLEX): HIV Screen 4th Generation wRfx: NONREACTIVE

## 2016-08-05 LAB — GLUCOSE, CAPILLARY
GLUCOSE-CAPILLARY: 111 mg/dL — AB (ref 65–99)
GLUCOSE-CAPILLARY: 115 mg/dL — AB (ref 65–99)
Glucose-Capillary: 88 mg/dL (ref 65–99)

## 2016-08-05 LAB — VITAMIN B12: VITAMIN B 12: 386 pg/mL (ref 180–914)

## 2016-08-05 LAB — CBC
HEMATOCRIT: 31.3 % — AB (ref 39.0–52.0)
HEMOGLOBIN: 10.5 g/dL — AB (ref 13.0–17.0)
MCH: 28.2 pg (ref 26.0–34.0)
MCHC: 33.5 g/dL (ref 30.0–36.0)
MCV: 84.1 fL (ref 78.0–100.0)
Platelets: 201 10*3/uL (ref 150–400)
RBC: 3.72 MIL/uL — AB (ref 4.22–5.81)
RDW: 14.6 % (ref 11.5–15.5)
WBC: 10 10*3/uL (ref 4.0–10.5)

## 2016-08-05 LAB — MRSA PCR SCREENING: MRSA by PCR: NEGATIVE

## 2016-08-05 LAB — IRON AND TIBC
IRON: 25 ug/dL — AB (ref 45–182)
Saturation Ratios: 9 % — ABNORMAL LOW (ref 17.9–39.5)
TIBC: 291 ug/dL (ref 250–450)
UIBC: 266 ug/dL

## 2016-08-05 LAB — FERRITIN: Ferritin: 136 ng/mL (ref 24–336)

## 2016-08-05 LAB — FOLATE: Folate: 7.4 ng/mL (ref >5.9)

## 2016-08-05 MED ORDER — SENNOSIDES-DOCUSATE SODIUM 8.6-50 MG PO TABS: 1.0000 | ORAL_TABLET | Freq: Every evening | ORAL | Status: DC | PRN

## 2016-08-05 MED ORDER — ACETAMINOPHEN 650 MG RE SUPP: 650.0000 mg | RECTAL | Status: DC | PRN

## 2016-08-05 MED ORDER — ENSURE ENLIVE PO LIQD: 237.0000 mL | Freq: Two times a day (BID) | ORAL | Status: DC

## 2016-08-05 MED ORDER — POTASSIUM CHLORIDE CRYS ER 20 MEQ PO TBCR
40.0000 meq | EXTENDED_RELEASE_TABLET | Freq: Once | ORAL | Status: AC
  Administered 2016-08-05: 40 meq via ORAL
  Filled 2016-08-05: qty 2

## 2016-08-05 MED ORDER — HYDRALAZINE HCL 20 MG/ML IJ SOLN
10.0000 mg | INTRAMUSCULAR | Status: DC | PRN
Start: 2016-08-05 — End: 2016-08-05

## 2016-08-05 MED ORDER — ASPIRIN 300 MG RE SUPP: 300.0000 mg | Freq: Every day | RECTAL | Status: DC

## 2016-08-05 MED ORDER — ACETAMINOPHEN 160 MG/5ML PO SOLN: 650.0000 mg | ORAL | Status: DC | PRN

## 2016-08-05 MED ORDER — HYDRALAZINE HCL 20 MG/ML IJ SOLN
10.0000 mg | INTRAMUSCULAR | Status: DC | PRN
  Administered 2016-08-05 – 2016-08-07 (×5): 10 mg via INTRAVENOUS
  Filled 2016-08-05 (×5): qty 1

## 2016-08-05 MED ORDER — ACETAMINOPHEN 325 MG PO TABS
650.0000 mg | ORAL_TABLET | ORAL | Status: DC | PRN
  Administered 2016-08-05: 650 mg via ORAL
  Filled 2016-08-05: qty 2

## 2016-08-05 MED ORDER — ASPIRIN 325 MG PO TABS
325.0000 mg | ORAL_TABLET | Freq: Every day | ORAL | Status: DC
  Administered 2016-08-05 – 2016-08-08 (×4): 325 mg via ORAL
  Filled 2016-08-05 (×4): qty 1

## 2016-08-05 NOTE — ED Notes (Signed)
Patient transported to Ultrasound 

## 2016-08-05 NOTE — Consult Note (Signed)
Requesting Physician: Dr. Cena Benton    Chief Complaint:  Stroke  History obtained from:  Patient   Chart    HPI:                                                                                                                                         Matthew Guerra is an 56 y.o. male is unable to give a detailed history. In fact during the history he seemed to not have very good insight on what was going on. Apparently the patient came to the ED secondary to feeling overall weakness. On admission to the ED patient's blood pressure was greater than 220 systolically. Patient does have a history of prior strokes, hypertension, chronic kidney disease, medication noncompliance. Patient states that he saw taking his antihypertensive medications many years ago and does not take aspirin but only when he has a headache. He denies smoking or doing drugs/alcohol. He was admitted to the hospital for hypertensive emergency with right-sided weakness, headache and nausea vomiting. At this point patient denies any headache but still feels that he is slightly weak on the right side. Due to this MRI was obtained. MRI revealed patchy subcentimeter acute ischemic infarcts involving the left basal ganglia, signal abnormality within the right greater than left centrum semiovale, remote large left PCA territory infarct associated with ex vacuo dilatation of the left lateral ventricle.  On consultation physical therapy was working with patient. Upon patient standing he clearly drifted to the left and had a difficult time maintaining an upright position. He did not report being dizzy or feeling any vertiginous sensation. He did have a right hemianopsia which he knew about and states that that occurred during a old stroke.  Date last known well: Unable to determine Time last known well: Unable to determine tPA Given: No: out of window   Past Medical History:  Diagnosis Date  . Hypertension   . Stroke [facility])     History reviewed.  No pertinent surgical history.  No family history on file. Social History:  reports that he has never smoked. He has never used smokeless tobacco. He reports that he does not drink alcohol. His drug history is not on file.  Allergies: No Known Allergies  Medications:  Scheduled: . aspirin  300 mg Rectal Daily   Or  . aspirin  325 mg Oral Daily  . feeding supplement (ENSURE ENLIVE)  237 mL Oral BID BM    ROS:                                                                                                                                       History obtained from the patient  General ROS: negative for - chills, fatigue, fever, night sweats, weight gain or weight loss Psychological ROS: negative for - behavioral disorder, hallucinations, memory difficulties, mood swings or suicidal ideation Ophthalmic ROS: negative for - blurry vision, double vision, eye pain or loss of vision ENT ROS: negative for - epistaxis, nasal discharge, oral lesions, sore throat, tinnitus or vertigo Allergy and Immunology ROS: negative for - hives or itchy/watery eyes Hematological and Lymphatic ROS: negative for - bleeding problems, bruising or swollen lymph nodes Endocrine ROS: negative for - galactorrhea, hair pattern changes, polydipsia/polyuria or temperature intolerance Respiratory ROS: negative for - cough, hemoptysis, shortness of breath or wheezing Cardiovascular ROS: negative for - chest pain, dyspnea on exertion, edema or irregular heartbeat Gastrointestinal ROS: negative for - abdominal pain, diarrhea, hematemesis, nausea/vomiting or stool incontinence Genito-Urinary ROS: negative for - dysuria, hematuria, incontinence or urinary frequency/urgency Musculoskeletal ROS: negative for - joint swelling or muscular weakness Neurological ROS: as noted in HPI Dermatological ROS: negative  for rash and skin lesion changes  Neurologic Examination:                                                                                                      Blood pressure (!) 163/109, pulse 75, temperature 97.6 F (36.4 C), temperature source Oral, resp. rate 16, height  (1.727 m), weight 75.3 kg (166 lb), SpO2 100 %.  HEENT-  Normocephalic, no lesions, without obvious abnormality.  Normal external eye and conjunctiva.  Normal TM's bilaterally.  Normal auditory canals and external ears. Normal external nose, mucus membranes and septum.  Normal pharynx. Cardiovascular- S1, S2 normal, pulses palpable throughout   Lungs- chest clear, no wheezing, rales, normal symmetric air entry Abdomen- normal findings: bowel sounds normal Extremities- no edema Lymph-no adenopathy palpable Musculoskeletal-no joint tenderness, deformity or swelling Skin-warm and dry, no hyperpigmentation, vitiligo, or suspicious lesions  Neurological Examination Mental Status: Alert, oriented to hospital but was unable to give me the month and was significant thought processing he was able to state that the year was 2018. Patient's insight 12  was going on I believe was less than what would be expected for his age.  Speech fluent without evidence of aphasia.  Able to follow 3 step commands without difficulty. Cranial Nerves: II: Old right hemianopsia,  III,IV, VI: ptosis not present, extra-ocular motions intact bilaterally, pupils equal, round, reactive to light and accommodation V,VII: smile slightly asymmetrical on the right , facial light touch sensation normal bilaterally VIII: hearing normal bilaterally IX,X: uvula rises symmetrically XI: bilateral shoulder shrug XII: midline tongue extension Motor: Right : Upper extremity   4+/5    Left:     Upper extremity   5/5  Lower extremity   5/5     Lower extremity   5/5 Tone and bulk:normal tone throughout; no atrophy noted Sensory: Pinprick and light touch intact  throughout, bilaterally Deep Tendon Reflexes: 2+ and brisk and symmetric throughout Plantars: Right: downgoing   Left: downgoing Cerebellar: Significantly ataxic on the right with finger-to-nose--the left showed mild ataxia with finger to nose, and dysmetric heel-to-shin test on the right greater than left Gait: As noted above       Lab Results: Basic Metabolic Panel:  Recent Labs Lab 08/04/16 2140 08/05/16 0314  NA 134* 137  K 3.0* 3.1*  CL 100* 100*  CO2 22 24  GLUCOSE 202* 138*  BUN 22* 22*  CREATININE 3.07* 2.90*  CALCIUM 8.4* 8.8*    Liver Function Tests: No results for input(s): AST, ALT, ALKPHOS, BILITOT, PROT, ALBUMIN in the last 168 hours. No results for input(s): LIPASE, AMYLASE in the last 168 hours. No results for input(s): AMMONIA in the last 168 hours.  CBC:  Recent Labs Lab 08/04/16 2140 08/05/16 0314  WBC 11.6* 10.0  NEUTROABS 10.1*  --   HGB 10.3* 10.5*  HCT 30.8* 31.3*  MCV 83.9 84.1  PLT 201 201    Cardiac Enzymes: No results for input(s): CKTOTAL, CKMB, CKMBINDEX, TROPONINI in the last 168 hours.  Lipid Panel: No results for input(s): CHOL, TRIG, HDL, CHOLHDL, VLDL, LDLCALC in the last 168 hours.  CBG:  Recent Labs Lab 08/04/16 2231 08/05/16 0745  GLUCAP 167* 88    Microbiology: Results for orders placed or performed during the hospital encounter of 08/04/16  MRSA PCR Screening     Status: None   Collection Time: 08/05/16  3:19 AM  Result Value Ref Range Status   MRSA by PCR NEGATIVE NEGATIVE Final    Comment:        The GeneXpert MRSA Assay (FDA approved for NASAL specimens only), is one component of a comprehensive MRSA colonization surveillance program. It is not intended to diagnose MRSA infection nor to guide or monitor treatment for MRSA infections.     Coagulation Studies: No results for input(s): LABPROT, INR in the last 72 hours.  Imaging: Dg Chest 2 View  Result Date: 08/04/2016 CLINICAL DATA:   Assess for hemorrhage. History of hypertension and stroke. EXAM: CHEST  2 VIEW COMPARISON:  Chest radiograph November 09, 2009 FINDINGS: Cardiomediastinal silhouette is normal. No pleural effusions or focal consolidations. Trachea projects midline and there is no pneumothorax. Soft tissue planes and included osseous structures are non-suspicious. Surgical clips project in the upper abdomen. IMPRESSION: Stable examination:  No acute cardiopulmonary process. Electronically Signed   By: Awilda Metro M.D.   On: 08/04/2016 22:03   Ct Head Wo Contrast  Result Date: 08/04/2016 CLINICAL DATA:  Old left PCA infarct weakness and vomiting EXAM: CT HEAD WITHOUT CONTRAST TECHNIQUE: Contiguous axial images were obtained from the  base of the skull through the vertex without intravenous contrast. COMPARISON:  10/21/2012 FINDINGS: Brain: Old encephalomalacia of the left occipital lobe, posterior thalamus and posterior temporal lobes with ex vacuo dilatation of the left lateral ventricle. Old appearing lacunar infarct in pons but new since 2014. New but nonacute appearing lacunar infarcts within the bilateral basal ganglia. Extensive hypodensity is now visualized within the bilateral periventricular and subcortical white matter. Probable old lacunar infarcts within the anterior white matter. No hemorrhage. No mass. No midline shift. Slight interval enlargement of the ventricles since the prior exam. Vascular: No hyperdense vessels. Scattered calcifications at the carotid siphons. Skull: No fracture or suspicious bone lesion Sinuses/Orbits: Mild mucosal thickening in the ethmoid sinuses. No acute orbital abnormality. Right lens extraction Other: None IMPRESSION: 1. No hemorrhage or mass lesion is visualized. 2. Old left posterior infarct with ex vacuo dilatation of the left lateral ventricle, slightly progressed since 2014. 3. Old appearing multifocal lacunar infarcts within the bilateral basal ganglia and pons, but new since  2014 4. Extensive hypodensity within the periventricular and subcortical white matter presumably due to progression of small vessel ischemic changes. This could potentially obscure acute edema ; MRI follow-up may be performed as clinically indicated. Electronically Signed   By: Jasmine Pang M.D.   On: 08/04/2016 22:30   Mr Brain Wo Contrast  Result Date: 08/05/2016 CLINICAL DATA:  Initial evaluation for hypertensive emergency with right-sided weakness. Headache. EXAM: MRI HEAD WITHOUT CONTRAST TECHNIQUE: Multiplanar, multiecho pulse sequences of the brain and surrounding structures were obtained without intravenous contrast. COMPARISON:  Prior CT from 08/04/2016 and prior MRI from 09/07/2009. FINDINGS: Brain: Generalized age-related cerebral atrophy. Patchy and confluent T2/FLAIR hyperintensity within the periventricular and deep white matter both cerebral hemispheres most consistent with chronic small vessel ischemic disease, moderately advanced. Multiple remote lacunar infarcts present within the bilateral basal ganglia and pons. Scattered remote lacunar infarcts present within the periventricular white matter of the corona radiata bilaterally as well. Remote left PCA territory infarct with associated ex vacuo dilatation of the left lateral ventricle. Patchy restricted diffusion within the left lentiform nucleus, extending into the left caudate (series 3, image 30, 35), consistent with acute ischemic infarct. These measure up to 6 mm. No associated hemorrhage or mass effect. Few additional vague diffusion signal foci present within the deep/subcortical white matter of the posterior right centrum semi ovale also suspicious for possible small vessel ischemic changes (series 3, image 35, 40). This is more subacute in appearance. No associated hemorrhage or mass effect. Mild diffusion abnormality also present within the mid left centrum semi ovale, also suspicious for possible subacute small vessel ischemia  (series 3, image 38). No other evidence for acute intracranial infarct. Gray-white matter differentiation otherwise maintained. Incidental note made of a few scattered chronic micro hemorrhages, most notable within the left parietal lobe and left thalamus, likely due to chronic underlying hypertension. Chronic blood products also noted about the remote left PCA territory infarct. No mass lesion, midline shift or mass effect. Ex vacuo dilatation left lateral ventricle related to left temporal occipital encephalomalacia. No hydrocephalus. No extra-axial fluid collection. Major dural sinuses are grossly patent. Pituitary gland suprasellar region within normal limits. Vascular: Major intracranial vascular flow voids are maintained. Skull and upper cervical spine: Craniocervical junction within normal limits. Visualized upper cervical spine within normal limits. Bone marrow signal intensity normal. No scalp soft tissue abnormality. Sinuses/Orbits: Globes and oval soft tissues within normal limits. Patient status post lens extraction on the right. Paranasal sinuses  are clear. Trace left mastoid effusion. Inner ear structures normal. Other: None. IMPRESSION: 1. Patchy subcentimeter acute ischemic infarcts involving the left basal ganglia. No associated hemorrhage or mass effect. 2. Additional subcentimeter patchy diffusion signal abnormality within the right greater than left centrum semi ovale, most suggestive of subacute small vessel ischemic changes. 3. Remote left PCA territory infarct with associated ex vacuo dilatation of the left lateral ventricle. 4. Advanced chronic microvascular ischemic disease for patient age with scattered remote lacunar infarcts involving the bilateral basal ganglia and pons. Electronically Signed   By: Rise Mu M.D.   On: 08/05/2016 03:09   US Renal  Result Date: 08/05/2016 CLINICAL DATA:  Acute kidney injury with elevated BUN and creatinine. Previous left nephrectomy. EXAM:  RENAL / URINARY TRACT ULTRASOUND COMPLETE COMPARISON:  07/15/2011 FINDINGS: Right Kidney: Length: 11 cm. Echogenicity within normal limits. No mass or hydronephrosis visualized. Left Kidney: Left kidney is surgically absent. No mass or fluid collection in the left renal fossa. Bladder: No bladder wall thickening or filling defect. Bladder is not abnormally distended. Incidental note of enlarged prostate gland, measuring 3.6 x 3.2 x 3.5 cm. IMPRESSION: Normal ultrasound appearance of the right kidney. No hydronephrosis. Left kidney is surgically absent. Mild prostate enlargement. Electronically Signed   By: Burman Nieves M.D.   On: 08/05/2016 01:28       Assessment and plan discussed with with attending physician and they are in agreement.    Felicie Morn PA-C [facility] [phone]  08/05/2016, 11:27 AM   Assessment: 56 y.o. male with medical noncompliance who does not take aspirin and presented to the ED with generalized weakness. Currently patient has many internal medicine issues including hypoglycemia hypokalemia and kidney disease. MRI shows what appears to be cardioembolic scatter of emboli. Patient in addition, has a large left occipital lobe infarct which is old.  Stroke Risk Factors - hypertension  Recommend: 1. HgbA1c, fasting lipid panel 2. MRI, MRA  of the brain without contrast 3. PT consult, OT consult, Speech consult 4. Echocardiogram 5. Carotid dopplers 6. Prophylactic therapy-Antiplatelet med: Aspirin - dose 325 mg daily 7. Risk factor modification 8. Telemetry monitoring 9. Frequent neuro checks 10 NPO until passes stroke swallow screen 11 please page stroke NP  Or  PA  Or MD from 8am -4 pm  as this patient from this time will be  followed by the stroke.   You can look them up on [email or web address]  Password [email or web address]

## 2016-08-05 NOTE — Progress Notes (Signed)
Patient is a 56 year old with history of noncompliance with medication regimen and hypertension. Who presented with headaches and right-sided weakness as well as nausea and vomiting. Nausea and vomiting he reports has resolved. Patient has no new complaints. MRI of brain reports new acute strokes. Consulted neurology will plan on evaluating patient today.  From my and will change hydralazine when necessary parameters to treat blood pressures if systolic blood pressure more than 220 or diastolic blood pressure 110 or above.  Vital signs stable Gen.: Patient in no acute distress Cardiovascular: No cyanosis Pulmonary: No increased work of breathing  We will plan on reassessing next a.m.  Drena Ham, [employer]

## 2016-08-05 NOTE — Progress Notes (Signed)
Occupational Therapy Evaluation Patient Details Name: Matthew Guerra MRN: [id number] DOB: 1960-09-08 Today's Date: 08/05/2016    History of Present Illness 56 year old with history of noncompliance with medication regimen and hypertension. Who presented with headaches and right-sided weakness as well as nausea and vomiting. PMH: CKD (stage 3); HTN; L PCA CVA.    Clinical Impression   PTA, pt states he lived at home with his roommate and was modified independent with mobility and ADL. Pt states he did not use an AD for mobility. R field cut from previous CVA. Pt currently requires mod A with functional mobility ( mod A requried to prevent fall during session) and Min A with basic ADL tasks and mod A with IADL tasks. Pt demonstrates poor insight/awareness of deficits and demonstrates a significant functional decline from his baseline. At this time, recommend CIR for rehab to maximize functional level of independence and facilitate safe DC home. Will follow acutely to address established goals to facilitate safe DC to next venue of care.     Follow Up Recommendations  CIR;Supervision/Assistance - 24 hour    Equipment Recommendations  Tub/shower bench    Recommendations for Other Services Rehab consult     Precautions / Restrictions Precautions Precautions: Fall Precaution Comments: watch BP Restrictions Weight Bearing Restrictions: No      Mobility Bed Mobility Overal bed mobility: Needs Assistance Bed Mobility: Sit to Supine     Supine to sit: Supervision     General bed mobility comments: assist for safety  Transfers Overall transfer level: Needs assistance Equipment used: 1 person hand held assist Transfers: Sit to/from Stand;Stand Pivot Transfers Sit to Stand: Min assist Stand pivot transfers: Mod assist       General transfer comment: assist due to imbalance leaning to L in standing, better with RW than HHA    Balance Overall balance assessment: Needs assistance    Sitting balance-Leahy Scale: Good   Postural control: Left lateral lean Standing balance support: Bilateral upper extremity supported Standing balance-Leahy Scale: Poor Standing balance comment: needs mod support for balance without device                           ADL either performed or assessed with clinical judgement   ADL Overall ADL's : Needs assistance/impaired     Grooming: Wash/dry face;Wash/dry hands;Min guard;Standing   Upper Body Bathing: Supervision/ safety;Set up;Sitting   Lower Body Bathing: Minimal assistance;Sit to/from stand   Upper Body Dressing : Supervision/safety;Set up;Sitting   Lower Body Dressing: Minimal assistance;Sit to/from stand   Toilet Transfer: Minimal assistance;Ambulation   Toileting- Clothing Manipulation and Hygiene: Minimal assistance;Sit to/from stand       Functional mobility during ADLs: Moderate assistance General ADL Comments: Pt with poor safety awareness. Impulsive. Reqrueid mod A to prevent fall during funcitonal mobility without using AD (states this is his baseline).      Vision   Vision Assessment?: Vision impaired- to be further tested in functional context Additional Comments: R field cut from prior CVA     Perception Perception Comments: impaired depth perception   Praxis Praxis Praxis tested?: Within functional limits    Pertinent Vitals/Pain Pain Assessment: No/denies pain     Hand Dominance Left   Extremity/Trunk Assessment Upper Extremity Assessment Upper Extremity Assessment: RUE deficits/detail RUE Deficits / Details: Pt using R UE as funcitonal assist. Apaprent sensorimotor deficits. States this is his baseline. General weakness LUE, but Pt using LUE for functional tasks without difficulty.  Lower Extremity Assessment Lower Extremity Assessment: Defer to PT evaluation   Cervical / Trunk Assessment Cervical / Trunk Assessment: Other exceptions Cervical / Trunk Exceptions: L lbias. Poor  midline orientation   Communication Communication Communication: No difficulties   Cognition Arousal/Alertness: Awake/alert Behavior During Therapy: Agitated; appeared irritated when therapist turned off TV Overall Cognitive Status: No family/caregiver present to determine baseline cognitive functioning  Impaired awareness of deficits, safety/judgement - pt trying to get out of chair by himself; Significant L lateral lean.   Previously used RW wiht PT. Pt did not recall using RW and stated "I don't have to use no walker to get around"  ? STM deficits Selective attention                           General Comments: impaired safty/judgement and awareness of deficits   General Comments       Exercises     Shoulder Instructions      Home Living Family/patient expects to be discharged to:: Private residence Living Arrangements: Non-relatives/Friends Available Help at Discharge: Friend(s) Type of Home: Apartment Home Access: Level entry     Home Layout: One level     Bathroom Shower/Tub: Producer, television/film/video: Standard Bathroom Accessibility: Yes How Accessible: Accessible via walker Home Equipment: Cane - single point;Walker - 2 wheels          Prior Functioning/Environment Level of Independence: Independent        Comments: prior RUE deficits and R field cut from previous CVA        OT Problem List: Decreased strength;Decreased activity tolerance;Impaired balance (sitting and/or standing);Impaired vision/perception;Decreased coordination;Decreased cognition;Decreased safety awareness;Decreased knowledge of use of DME or AE;Impaired sensation;Impaired tone;Impaired UE functional use;Pain      OT Treatment/Interventions: Self-care/ADL training;Therapeutic exercise;Neuromuscular education;DME and/or AE instruction;Therapeutic activities;Cognitive remediation/compensation;Visual/perceptual remediation/compensation;Patient/family education;Balance  training    OT Goals(Current goals can be found in the care plan section) Acute Rehab OT Goals Patient Stated Goal: to go home OT Goal Formulation: With patient Time For Goal Achievement: 08/19/16 Potential to Achieve Goals: Good ADL Goals Pt Will Perform Upper Body Bathing: with set-up;sitting Pt Will Perform Lower Body Bathing: with supervision;sit to/from stand Pt Will Perform Upper Body Dressing: with set-up;sitting Pt Will Perform Lower Body Dressing: with set-up;sit to/from stand Pt Will Transfer to Toilet: with supervision;ambulating;regular height toilet Pt Will Perform Toileting - Clothing Manipulation and hygiene: with supervision;sit to/from stand Additional ADL Goal #1: Pt will demonstrate insight into 2 fall risk situations with min vc  OT Frequency: Min 3X/week   Barriers to D/C: Other (comment) (unsure of caregiver support)          Co-evaluation              End of Session Equipment Utilized During Treatment: Gait belt Nurse Communication: Mobility status  Activity Tolerance: Patient tolerated treatment well Patient left: in bed;with call bell/phone within reach;with bed alarm set  OT Visit Diagnosis: Unsteadiness on feet (R26.81);Other abnormalities of gait and mobility (R26.89);Other symptoms and signs involving the nervous system (E95.284)                Time: 1324-4010 OT Time Calculation (min): 23 min Charges:    G-Codes:     Serenah Mill, OTR/L  [phone] 08/05/2016  Graig Hessling,HILLARY 08/05/2016, 2:19 PM

## 2016-08-05 NOTE — Evaluation (Signed)
Physical Therapy Evaluation Patient Details Name: Matthew Guerra MRN: [id number] DOB: [birth year] Today's Date: 08/05/2016   History of Present Illness  56 year old with history of noncompliance with medication regimen and hypertension. Who presented with headaches and right-sided weakness as well as nausea and vomiting. PMH: CKD (stage 3); HTN; L PCA CVA.   Clinical Impression  Patient presents with decreased independence due to L lateral lean with poor midline orientation and some R side weakness as well as decreased deficit awareness and poor insight.  Feel he will benefit from skilled PT in the acute setting as well as follow up CIR level rehab prior to d/c home.      Follow Up Recommendations CIR    Equipment Recommendations  Rolling walker with 5" wheels    Recommendations for Other Services Rehab consult     Precautions / Restrictions Precautions Precautions: Fall Precaution Comments: watch BP Restrictions Weight Bearing Restrictions: No      Mobility  Bed Mobility Overal bed mobility: Needs Assistance Bed Mobility: Sit to Supine     Supine to sit: Supervision     General bed mobility comments: assist for safety  Transfers Overall transfer level: Needs assistance Equipment used: 1 person hand held assist Transfers: Sit to/from Stand;Stand Pivot Transfers Sit to Stand: Min assist Stand pivot transfers: Mod assist       General transfer comment: assist due to imbalance leaning to L in standing, better with RW than HHA  Ambulation/Gait Ambulation/Gait assistance: Mod assist;Min assist Ambulation Distance (Feet): 200 Feet (20') Assistive device: 1 person hand held assist;Rolling walker (2 wheeled) Gait Pattern/deviations: Step-through pattern;Wide base of support;Decreased stride length     General Gait Details: leaning to L with HHA needed mod support; with RW min assist for balance and cues for safety with walker; HR up to 124 with ambulation   Stairs             Wheelchair Mobility    Modified Rankin (Stroke Patients Only) Modified Rankin (Stroke Patients Only) Pre-Morbid Rankin Score: Slight disability Modified Rankin: Moderately severe disability     Balance Overall balance assessment: Needs assistance   Sitting balance-Leahy Scale: Good   Postural control: Left lateral lean Standing balance support: Bilateral upper extremity supported Standing balance-Leahy Scale: Poor Standing balance comment: needs mod support for balance without device                             Pertinent Vitals/Pain Pain Assessment: No/denies pain    Home Living Family/patient expects to be discharged to:: Private residence Living Arrangements: Non-relatives/Friends Available Help at Discharge: Friend(s) Type of Home: Apartment Home Access: Level entry     Home Layout: One level Home Equipment: Cane - single point;Walker - 2 wheels      Prior Function Level of Independence: Independent         Comments: prior RUE deficits and R field cut from previous CVA     Hand Dominance   Dominant Hand: Left    Extremity/Trunk Assessment   Upper Extremity Assessment Upper Extremity Assessment: RUE deficits/detail RUE Deficits / Details: Pt using R UE as funcitonal assist. Apaprent sensorimotor deficits. States this is his baseline. General weakness LUE, but Pt using LUE for functional tasks without difficulty.    Lower Extremity Assessment Lower Extremity Assessment: Defer to PT evaluation    Cervical / Trunk Assessment Cervical / Trunk Assessment: Other exceptions Cervical / Trunk Exceptions: L lbias. Poor midline orientation  Communication   Communication: No difficulties  Cognition Arousal/Alertness: Awake/alert Behavior During Therapy: Agitated Overall Cognitive Status: No family/caregiver present to determine baseline cognitive functioning                                 General Comments: impaired  safty/judgement and awareness of deficits      General Comments      Exercises     Assessment/Plan    PT Assessment Patient needs continued PT services  PT Problem List Decreased strength;Decreased activity tolerance;Decreased balance;Decreased knowledge of use of DME;Decreased cognition;Decreased coordination;Decreased mobility;Decreased safety awareness       PT Treatment Interventions DME instruction;Gait training;Balance training;Functional mobility training;Neuromuscular re-education;Therapeutic exercise;Patient/family education;Therapeutic activities    PT Goals (Current goals can be found in the Care Plan section)  Acute Rehab PT Goals Patient Stated Goal: to go home PT Goal Formulation: With patient Time For Goal Achievement: 08/12/16 Potential to Achieve Goals: Fair    Frequency Min 3X/week   Barriers to discharge        Co-evaluation               End of Session Equipment Utilized During Treatment: Gait belt Activity Tolerance: Patient tolerated treatment well Patient left: in chair;with call bell/phone within reach;with chair alarm set   PT Visit Diagnosis: Other abnormalities of gait and mobility (R26.89);Muscle weakness (generalized) (M62.81)    Time: 1610-9604 PT Time Calculation (min) (ACUTE ONLY): 24 min   Charges:   PT Evaluation $PT Eval Moderate Complexity: 1 Procedure PT Treatments $Gait Training: 8-22 mins   PT G Codes:   PT G-Codes **NOT FOR INPATIENT CLASS** Functional Assessment Tool Used: AM-PAC 6 Clicks Basic Mobility Functional Limitation: Mobility: Walking and moving around Mobility: Walking and Moving Around Current Status (V4098): At least 40 percent but less than 60 percent impaired, limited or restricted Mobility: Walking and Moving Around Goal Status [phone]): At least 20 percent but less than 40 percent impaired, limited or restricted    [place], [place] [phone] 08/05/2016   Matthew Guerra 08/05/2016, 2:34 PM

## 2016-08-05 NOTE — Progress Notes (Signed)
Rehab Admissions Coordinator Note:  Patient was screened by Clois Dupes for appropriateness for an Inpatient Acute Rehab Consult per PT and OT recommendations.  At this time, we are recommending await medical workup and more therapy before determining rehab venue options.Clois Dupes 08/05/2016, 2:59 PM  I can be reached at [phone].

## 2016-08-05 NOTE — ED Notes (Signed)
Delay in lab draw,  Pt in MRI. 

## 2016-08-05 NOTE — H&P (Signed)
History and Physical    Matthew Guerra ZOX:[id number] DOB: [birth year] DOA: 08/04/2016  PCP: No PCP Per Patient   Patient coming from: Home via EMS  Chief Complaint: Generalized weakness, nausea, vomiting  HPI: Matthew Guerra is a 56 y.o. gentleman with a history of multiple prior strokes, HTN, chronic kidney disease (at least stage 3; creatinine five years ago was greater than 2), and medical noncompliance (he reports that he stopped taking his anti-hypertensives "years ago") who presents to the ED for evaluation of generalized weakness with nausea and vomiting.  Reportedly, the patient was also mildly disoriented upon arrival as well.  He has had intermittent headaches but no chest pain, shortness of breath, or swelling.  He has had light-headedness but no syncope.  No abdominal pain, diarrhea, or constipation.  He had had a decreased appetite but denies change in taste.  He has noted decreased urine output but no dysuria or hematuria.  Nausea and vomiting started to today.  No documented fever.  ED Course: Systolic blood pressure was greater than 220 upon arrival in the ED.  Systolic blood pressure has been 160's-170's after one dose of IV hydralazine .  EKG shows NSR with LVH.  No acute ST segment changes.  WBC count 11.6.  Hgb 10.  Potassium 3.  Creatinine 3.  BUN 22.  Bicarb 22.  Glucose 202.  UDS negative.  Chest xray negative for acute process.  Head CT show multiple old infarcts and progression of small vessel ischemic disease.  Mental status improved but patient still complains of nausea, generalized weakness, and generally not feeling well.  No active chest pain.  Hospitalist asked to admit for further evaluation.  Review of Systems: As per HPI otherwise 10 systems reviewed and negative.   Past Medical History:  Diagnosis Date  . Hypertension   . Stroke [facility])   CKD 3 Medical noncompliance.  History reviewed. No pertinent surgical history.  He denies any major  surgeries.  SOCIAL HISTORY: No tobacco or illicit drug use.  He drinks EtOH occasionally.  He is not married.  He has two adult children.  No Known Allergies  FAMILY HISTORY: Mother is deceased; she had a history of HTN. Father is living; he has HTN. Maternal grandparents also had HTN. Heart disease runs in the family.  Prior to Admission medications   Not on File    Physical Exam: Vitals:   08/05/16 0000 08/05/16 0015 08/05/16 0030 08/05/16 0032  BP: (!) 179/102 (!) 163/107 (!) 170/120   Pulse: 80 68 69   Resp: Temp:    97.5 F (36.4 C)  TempSrc:      SpO2: 100% 100% 100%       Constitutional: NAD, calm but somewhat ill appearing Vitals:   08/05/16 0000 08/05/16 0015 08/05/16 0030 08/05/16 0032  BP: (!) 179/102 (!) 163/107 (!) 170/120   Pulse: 80 68 69   Resp: Temp:    97.5 F (36.4 C)  TempSrc:      SpO2: 100% 100% 100%    Eyes: PERRL, lids and conjunctivae normal ENMT: Mucous membranes are moist. Normal dentition.  Neck: normal appearance, supple, no masses Respiratory: clear to auscultation bilaterally, no wheezing, no crackles. Normal respiratory effort. No accessory muscle use.  Cardiovascular: Normal rate, regular rhythm, no murmurs / rubs / gallops. No extremity edema. 2+ pedal pulses.  GI: abdomen is soft and compressible.  No distention.  No tenderness.  No masses palpated.  No guarding.  Bowel sounds are present. Musculoskeletal:  No joint deformity in upper and lower extremities. Good ROM, no contractures. Normal muscle tone.  Skin: no rashes, warm and dry Neurologic: CN 2-12 grossly intact. Sensation intact bilaterally; he had generalized weakness but his right upper and lower extremities are both weaker than the left. Psychiatric: Normal judgment and insight. Alert and oriented x 3. Normal mood.     Labs on Admission: I have personally reviewed following labs and imaging studies  CBC:  Recent Labs Lab 08/04/16 2140  WBC  11.6*  NEUTROABS 10.1*  HGB 10.3*  HCT 30.8*  MCV 83.9  PLT 201   Basic Metabolic Panel:  Recent Labs Lab 08/04/16 2140  NA 134*  K 3.0*  CL 100*  CO2 22  GLUCOSE 202*  BUN 22*  CREATININE 3.07*  CALCIUM 8.4*   GFR: CrCl cannot be calculated (Unknown ideal weight.).  CBG:  Recent Labs Lab 08/04/16 2231  GLUCAP 167*   Urine analysis:    Component Value Date/Time   COLORURINE STRAW (A) 08/04/2016 2125   APPEARANCEUR CLEAR 08/04/2016 2125   LABSPEC 1.010 08/04/2016 2125   PHURINE 7.0 08/04/2016 2125   GLUCOSEU 150 (A) 08/04/2016 2125   HGBUR SMALL (A) 08/04/2016 2125   BILIRUBINUR NEGATIVE 08/04/2016 2125   KETONESUR NEGATIVE 08/04/2016 2125   PROTEINUR 100 (A) 08/04/2016 2125   UROBILINOGEN 1.0 10/21/2012 1144   NITRITE NEGATIVE 08/04/2016 2125   LEUKOCYTESUR NEGATIVE 08/04/2016 2125   Radiological Exams on Admission: Dg Chest 2 View  Result Date: 08/04/2016 CLINICAL DATA:  Assess for hemorrhage. History of hypertension and stroke. EXAM: CHEST  2 VIEW COMPARISON:  Chest radiograph November 09, 2009 FINDINGS: Cardiomediastinal silhouette is normal. No pleural effusions or focal consolidations. Trachea projects midline and there is no pneumothorax. Soft tissue planes and included osseous structures are non-suspicious. Surgical clips project in the upper abdomen. IMPRESSION: Stable examination:  No acute cardiopulmonary process. Electronically Signed   By: Awilda Metro M.D.   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 COMPARISON:  10/21/2012 FINDINGS: Brain: Old encephalomalacia of the left occipital lobe, posterior thalamus and posterior temporal lobes with ex vacuo dilatation of the left lateral ventricle. Old appearing lacunar  infarct in pons but new since 2014. New but nonacute appearing lacunar infarcts within the bilateral basal ganglia. Extensive hypodensity is now visualized within the bilateral periventricular and subcortical white matter. Probable old lacunar infarcts within the anterior white matter. No hemorrhage. No mass. No midline shift. Slight interval enlargement of the ventricles since the prior exam. Vascular: No hyperdense vessels. Scattered calcifications at the carotid siphons. Skull: No fracture or suspicious bone lesion Sinuses/Orbits: Mild mucosal thickening in the ethmoid sinuses. No acute orbital abnormality. Right lens extraction Other: None IMPRESSION: 1. No hemorrhage or mass lesion is visualized. 2. Old left posterior infarct with ex vacuo dilatation of the left lateral ventricle, slightly progressed since 2014. 3. Old appearing multifocal lacunar infarcts within the bilateral basal ganglia and pons, but new since 2014 4. Extensive hypodensity within the periventricular and subcortical white matter presumably due to progression of small vessel ischemic changes. This could potentially obscure acute edema ; MRI follow-up may be performed as clinically indicated. Electronically Signed   By: Adrian Prows.D.  On: 08/04/2016 22:30    EKG: Independently reviewed. Noted above.  Assessment/Plan Principal Problem:   Hypertensive emergency Active Problems:   Right sided weakness   CKD (chronic kidney disease), stage III   Anemia   H/O noncompliance with medical treatment, presenting hazards to health   CVA (cerebral vascular accident) (HCC)      Hypertensive emergency with right sided weakness, headache, nausea and vomiting --Place in stepdown unit --Target blood pressure 160-180 systolic for now.  Currently at goal with low dose IV hydralazine.  Continue IV hydralazine  q4h prn for now. --Will need to start oral medication regimen in the AM --Will proceed with MRI to rule out acute CVA; if  MRI is positive for acute stroke, he will need formal neurology consultation and complete stroke work-up --Bedside swallow assessment now --Neurochecks per protocol  AKI on CKD 3 vs progressive CKD.  Patient does not appear to have a UTI.  Likely secondary to HTN. --Check renal ultrasound --S/P 1L NS in the ED --Repeat BMP in the AM --Screen for diabetes --Check HIV --Avoid nephrotoxic agents --Strict I/O, daily weights  Anemia, likely related to chronic renal disease --Anemia panel  Prior strokes --Start aspirin now, consider statin  Hypokalemia --Replacement ordered  DVT prophylaxis: SCDs Code Status: FULL Family Communication: Patient alone in the ED at time of admission. Disposition Plan: Expect he will go home at discharge. Consults called: NONE Admission status: Place in observation, stepdown unit.   TIME SPENT: 60 minutes   Jerene Bears MD [facility] Pager [phone]  If 7PM-7AM, please contact night-coverage [email or web address] Password [facility]  08/05/2016, 12:39 AM

## 2016-08-06 DIAGNOSIS — I6310 Cerebral infarction due to embolism of unspecified precerebral artery: Secondary | ICD-10-CM

## 2016-08-06 LAB — LIPID PANEL
CHOL/HDL RATIO: 6.1 ratio
CHOLESTEROL: 200 mg/dL (ref 0–200)
HDL: 33 mg/dL — ABNORMAL LOW (ref >40)
LDL Cholesterol: 130 mg/dL — ABNORMAL HIGH (ref 0–99)
TRIGLYCERIDES: 186 mg/dL — AB (ref <150)
VLDL: 37 mg/dL (ref 0–40)

## 2016-08-06 LAB — GLUCOSE, CAPILLARY: Glucose-Capillary: 119 mg/dL — ABNORMAL HIGH (ref 65–99)

## 2016-08-06 LAB — HEMOGLOBIN A1C
HEMOGLOBIN A1C: 5.3 % (ref 4.8–5.6)
Mean Plasma Glucose: 105 mg/dL

## 2016-08-06 MED ORDER — ATORVASTATIN CALCIUM 40 MG PO TABS
40.0000 mg | ORAL_TABLET | Freq: Every day | ORAL | Status: DC
  Administered 2016-08-06 – 2016-08-07 (×2): 40 mg via ORAL
  Filled 2016-08-06 (×2): qty 1

## 2016-08-06 MED ORDER — FERROUS SULFATE 325 (65 FE) MG PO TABS
325.0000 mg | ORAL_TABLET | Freq: Three times a day (TID) | ORAL | Status: DC
  Administered 2016-08-06 – 2016-08-08 (×6): 325 mg via ORAL
  Filled 2016-08-06 (×5): qty 1

## 2016-08-06 MED ORDER — AMLODIPINE BESYLATE 5 MG PO TABS
5.0000 mg | ORAL_TABLET | Freq: Every day | ORAL | Status: DC
  Administered 2016-08-06 – 2016-08-08 (×3): 5 mg via ORAL
  Filled 2016-08-06 (×3): qty 1

## 2016-08-06 NOTE — Progress Notes (Signed)
Pt refused to do his NIH this morning. Explained to the patient the importance of completing this exam. Pt still refused. Will try again later. Will continue to monitor.   Tera Helper E

## 2016-08-06 NOTE — Progress Notes (Signed)
Physical Therapy Treatment Patient Details Name: Matthew Guerra MRN: [id number] DOB: [birth year] Today's Date: 08/06/2016    History of Present Illness 56 year old with history of noncompliance with medication regimen and hypertension. Who presented with headaches and right-sided weakness as well as nausea and vomiting. PMH: CKD (stage 3); HTN; L PCA CVA.     PT Comments    Pt remains limited due to balance and safety deficits as well as tachycardia.  Bpm elevated to 150 at max during session.  Pt remains asymptomatic.     Follow Up Recommendations  CIR     Equipment Recommendations  Rolling walker with 5" wheels    Recommendations for Other Services Rehab consult     Precautions / Restrictions Precautions Precautions: Fall Precaution Comments: watch BP Restrictions Weight Bearing Restrictions: No    Mobility  Bed Mobility Overal bed mobility: Needs Assistance;Modified Independent Bed Mobility: Supine to Sit;Sit to Supine     Supine to sit: Modified independent (Device/Increase time)     General bed mobility comments: Good technique increased time to complete.    Transfers Overall transfer level: Needs assistance Equipment used: None Transfers: Sit to/from [employer] Sit to Stand: Min guard;Min assist         General transfer comment: Pt required assist to steady in standing.  No assist needed to rise but relying on leaning against bed with back side of Lower extremities to compensate.    Ambulation/Gait Ambulation/Gait assistance: Min guard;Min assist Ambulation Distance (Feet): 80 Feet (+40 ft gait distance limited due to elevated HR 120-150s bpm during gait.  )   Gait Pattern/deviations: Step-through pattern;Wide base of support;Decreased stride length     General Gait Details: Pt remains with lateral lean to the L.  NO AD used with mild LOB required min assist to correct.  Pt unaware of his deficits and asymptomatic to elevation of  HR   Stairs            Wheelchair Mobility    Modified Rankin (Stroke Patients Only)       Balance     Sitting balance-Leahy Scale: Good       Standing balance-Leahy Scale: Poor                              Cognition Arousal/Alertness: Awake/alert Behavior During Therapy: WFL for tasks assessed/performed Overall Cognitive Status: No family/caregiver present to determine baseline cognitive functioning                                 General Comments: impaired safty/judgement and awareness of deficits      Exercises General Exercises - Lower Extremity Hip Flexion/Marching: AROM;Both;10 reps;Standing Heel Raises: AROM;Both;10 reps;Standing Mini-Sqauts: AROM;Both;5 reps;Standing (repeated sit to stand without UE use.  )    General Comments        Pertinent Vitals/Pain Pain Assessment: No/denies pain    Home Living                      Prior Function            PT Goals (current goals can now be found in the care plan section) Acute Rehab PT Goals Potential to Achieve Goals: Fair Additional Goals Additional Goal #1: Patient to demonstrate improved deficit awareness with correction of L lateral lean with min verbal cues in static standing. Progress towards PT  goals: Progressing toward goals    Frequency    Min 3X/week      PT Plan Current plan remains appropriate    Co-evaluation             End of Session Equipment Utilized During Treatment: Gait belt Activity Tolerance: Patient tolerated treatment well Patient left: in chair;with call bell/phone within reach;with chair alarm set   PT Visit Diagnosis: Other abnormalities of gait and mobility (R26.89);Muscle weakness (generalized) (M62.81)     Time: 1610-9604 PT Time Calculation (min) (ACUTE ONLY): 23 min  Charges:  $Gait Training: 8-22 mins $Therapeutic Exercise: 8-22 mins                    G Codes:       Joycelyn Rua, PTA pager  [phone]    Florestine Avers 08/06/2016, 3:20 PM

## 2016-08-06 NOTE — Progress Notes (Signed)
Initial Nutrition Assessment  DOCUMENTATION CODES:   Non-severe (moderate) malnutrition in context of chronic illness  INTERVENTION:    Bedtime snack daily  NUTRITION DIAGNOSIS:   Malnutrition related to chronic illness (uncontrolled cardiovascular condition) as evidenced by mild depletion of body fat, mild depletion of muscle mass.  GOAL:   Patient will meet greater than or equal to 90% of their needs  MONITOR:   PO intake, Labs  REASON FOR ASSESSMENT:   Malnutrition Screening Tool    ASSESSMENT:   56 year old man with history of previous left PCA stroke several years ago who presented to the ED on 4/16 complaining of generalized weakness. His blood pressure was extremely elevated with systolic greater than 220. He was admitted for presumed hypertensive emergency. MRI scan of the brain was obtained during the hospitalization and demonstrated numerous scattered small areas of acute ischemic infarction.  Patient reports that he usually has a good appetite, but doesn't eat much because he just doesn't want to eat much. He says that he has access to plenty of food at home.  He does not like the Ensure supplements, will d/c. He agreed to receive a bedtime snack daily. Currently consuming 90-100% of meals.  Nutrition-Focused physical exam completed. Findings are mild-moderate fat depletion, mild-moderate muscle depletion, and no edema.  Labs and medications reviewed.  Diet Order:  Diet Heart Room service appropriate? Yes; Fluid consistency: Thin  Skin:  Reviewed, no issues  Last BM:  4/16  Height:   Ht Readings from Last 1 Encounters:  08/05/16  (1.727 m)    Weight:   Wt Readings from Last 1 Encounters:  08/06/16 168 lb 3.2 oz (76.3 kg)    Ideal Body Weight:  70 kg  BMI:  Body mass index is 25.57 kg/m.  Estimated Nutritional Needs:   Kcal:  2000-2200  Protein:  95-115 gm  Fluid:  2 L  EDUCATION NEEDS:   No education needs identified at this  time  Joaquin Courts, RD, LDN, CNSC Pager [phone] After Hours Pager [phone]

## 2016-08-06 NOTE — Progress Notes (Signed)
STROKE TEAM PROGRESS NOTE   SUBJECTIVE (INTERVAL HISTORY) Patient is stating he has no neurological complaints today. He has residual right homonymous hemianopsia and right hand weakness from his prior stroke in 2011.   OBJECTIVE Temp:  [97.8 F (36.6 C)-98.9 F (37.2 C)] 98.3 F (36.8 C) (04/18 1100) Pulse Rate:  [79-92] 91 (04/18 1100) Cardiac Rhythm: Normal sinus rhythm (04/18 0749) Resp:  [12-21] 14 (04/18 0740) BP: (150-205)/(88-137) 185/121 (04/18 1230) SpO2:  [98 %-100 %] 98 % (04/18 1100) Weight:  [76.3 kg (168 lb 3.2 oz)] 76.3 kg (168 lb 3.2 oz) (04/18 0426)  CBC:  Recent Labs Lab 08/04/16 2140 08/05/16 0314  WBC 11.6* 10.0  NEUTROABS 10.1*  --   HGB 10.3* 10.5*  HCT 30.8* 31.3*  MCV 83.9 84.1  PLT 201 201    Basic Metabolic Panel:  Recent Labs Lab 08/04/16 2140 08/05/16 0314  NA 134* 137  K 3.0* 3.1*  CL 100* 100*  CO2 22 24  GLUCOSE 202* 138*  BUN 22* 22*  CREATININE 3.07* 2.90*  CALCIUM 8.4* 8.8*    PHYSICAL EXAM Frail middle-aged African-American male not in distress.  . Afebrile. Head is nontraumatic. Neck is supple without bruit.    Cardiac exam no murmur or gallop. Lungs are clear to auscultation. Distal pulses are well felt. Neurological Exam :  Awake alert oriented 2. Cannot tell me the month of the year. Follows midline and one-step commands only. No aphasia or dysarthria. Extraocular moments are full range without nystagmus. Dense right homonymous hemianopsia. Fundi were not visualized. Face is symmetric without weakness. Tongue midline. Motor system exam no upper or lower extremity drift but significant weakness of right grip and intrinsic hand muscles with non-fixed finger flexion contractures. Mild spasticity in the right hand. Minimum weakness of right hip flexors and ankle dorsiflexors only. Deep tendon reflexes are brisker on the right compared to the left. Right plantar equivocal left downgoing. Coordination is impaired on the right.  Sensation is intact. Gait was not tested. ASSESSMENT/PLAN [name] is a 56 y.o. male with history of prior stroke, HTN, CKD, medication noncompliance presenting with generalized weakness, nausea and vomiting.  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   Stroke:  patchy left BG infarcts secondary to small vessel disease source  CT no hmg. Old L posteior infarct w/ dilation of L lateral ventricle. Old B BG and pontine lacunes. Extensive PV and subcortical WM small vessel disease.   MRI  Patchy L basal ganglia. R >L CSV small vessel disease. Old L PCA infarct w/ dilation of L lateral ventricle. small vessel disease. Scattered old lacunes B BG and pons  Carotid Doppler  pending   2D Echo  pending   TCD pending   TEE 08/2009 no PFO, source of embolus  LDL 130  HgbA1c 5.3  SCDs for VTE prophylaxis  Diet Heart Room service appropriate? Yes; Fluid consistency: Thin  No antithrombotic prior to admission, now on aspirin 325 mg daily  Therapy recommendations:  CIR.  Disposition:  SANE Consult   Hypertensive Urgency  SBP > 200 on arrival (205/137)  Remains elevated              Renal US normal R kidney. Surgically absent L kidney              From stroke standpoint, Permissive hypertension (OK if < 220/120) but gradually normalize in 5-7 days  Long-term BP goal normotensive  Hyperlipidemia  Home meds:  No statin  LDL 130, above goal  Add statin  Continue statin at discharge  Other Stroke Risk Factors  UDS / ETOH screen negative   Hx stroke/TIA  5/20177 L PCA infarct, embolic, no source found (TEE neg). D/c on coumadin d/t LLE DVT found.  Other Active Problems  AKI on CKD 3 vs progressive CKD  Anemia, d/t renal dz  Hypokalemia   Hospital day # 1  BIBY,SHARON  [facility] See Amion for Pager information 08/06/2016 1:17 PM  I have personally examined this patient, reviewed notes, independently viewed imaging  studies, participated in medical decision making and plan of care.ROS completed by me personally and pertinent positives fully documented  I have made any additions or clarifications directly to the above note. Agree with note above. He presented with left basal ganglia infarct secondary to small vessel disease. Recommend start aspirin for stroke prevention and aggressive risk factor modification and continue ongoing stroke workup. Discussed with Dr. Ella Jubilee. Greater than 50% time during this 35 minute visit was spent in counseling and coordination of care about his stroke, risk factor reduction and answered questions  Delia Heady, MD Medical Director [facility] Pager: [phone] 08/06/2016 2:04 PM  To contact Stroke Continuity provider, please refer to [email or web address]. After hours, contact General Neurology

## 2016-08-06 NOTE — Progress Notes (Addendum)
PROGRESS NOTE    Matthew Guerra  ZOX:[id number] DOB: 1961-03-11 DOA: 08/04/2016 PCP: No PCP Per Patient    Brief Narrative:  56 yo male presented with generalized weakness, nausea and vomiting. Symptoms associated with disorientation. Initial blood pressure 179/102 and 170/120. Moist mucous membranes, lungs were clear to auscultation, heart sounds rhythmic, no lower extremity edema. Cr up to 3.0 from baseline of 1.6. Admitted with working diagnosis of hypertensive emergency, complicated by AKI.    Assessment & Plan:   Principal Problem:   Hypertensive emergency Active Problems:   Right sided weakness   CKD (chronic kidney disease), stage III   Anemia   H/O noncompliance with medical treatment, presenting hazards to health   CVA (cerebral vascular accident) (HCC)   1. Hypertensive emergency. Patient within 48 hours of acute CVA presentation, will start antihypertensive therapy with calcium channel blocker, amlodipine. Hold on hctz due to decreased GFR. Target blood pressure systolic 160 over next 24 hours.   2. AKI on CKD stage 3. Continue blood pressure control, cr stable at 2.9 with K at 3,1 will avoid nephrotoxic medications.   3. Anemia. Hb and hct stable, iron panel with iron 25, TIBC 291 with iron saturation 9, cw iron deficiency, will start patient on fesulfate.   4. Hypokalemia. K at 3.1 will correct with caution due to decreased GFR, 20 meq kcl and follow renal panel in am.   5. Acute ischemic CVA. Ischemic at the basal ganglia on the left. will continue neuro checks per protocol, blood pressure control, antiplatelet therapy and statin. Follow on echocardiogram. Follow with neurology recommendations.   DVT prophylaxis: scd Code Status: full  Family Communication: no family at the bedside  Disposition Plan: home    Consultants:   Neurology    Procedures:     Antimicrobials:    Subjective: Patient feeling better, no nausea or vomiting. No chest pain or dyspnea.  No headache.   Objective: Vitals:   08/06/16 0426 08/06/16 0624 08/06/16 0740 08/06/16 0747  BP: (!) 150/115 (!) 173/88  (!) 164/106  Pulse: 79  82   Resp:  12 14   Temp: 98.4 F (36.9 C)  98.1 F (36.7 C)   TempSrc: Oral  Oral   SpO2: 100%  99%   Weight: 76.3 kg (168 lb 3.2 oz)     Height:        Intake/Output Summary (Last 24 hours) at 08/06/16 1036 Last data filed at 08/06/16 0900  Gross per 24 hour  Intake              265 ml  Output              600 ml  Net             -335 ml   Filed Weights   08/05/16 0248 08/06/16 0426  Weight: 75.3 kg (166 lb) 76.3 kg (168 lb 3.2 oz)    Examination:  General exam: Appears calm and comfortable  Respiratory system: Clear to auscultation. Respiratory effort normal. Cardiovascular system: S1 & S2 heard, RRR. No JVD, murmurs, rubs, gallops or clicks. No pedal edema. Gastrointestinal system: Abdomen is nondistended, soft and nontender. No organomegaly or masses felt. Normal bowel sounds heard. Central nervous system: Alert and oriented. No focal neurological deficits. Extremities: Symmetric 5 x 5 power. Skin: No rashes, lesions or ulcers Psychiatry: Judgement and insight appear normal. Mood & affect appropriate.     Data Reviewed: I have personally reviewed following labs and imaging studies  CBC:  Recent Labs Lab 08/04/16 2140 08/05/16 0314  WBC 11.6* 10.0  NEUTROABS 10.1*  --   HGB 10.3* 10.5*  HCT 30.8* 31.3*  MCV 83.9 84.1  PLT 201 201   Basic Metabolic Panel:  Recent Labs Lab 08/04/16 2140 08/05/16 0314  NA 134* 137  K 3.0* 3.1*  CL 100* 100*  CO2 22 24  GLUCOSE 202* 138*  BUN 22* 22*  CREATININE 3.07* 2.90*  CALCIUM 8.4* 8.8*   GFR: Estimated Creatinine Clearance: 26.9 mL/min (A) (by C-G formula based on SCr of 2.9 mg/dL (H)). Liver Function Tests: No results for input(s): AST, ALT, ALKPHOS, BILITOT, PROT, ALBUMIN in the last 168 hours. No results for input(s): LIPASE, AMYLASE in the last 168  hours. No results for input(s): AMMONIA in the last 168 hours. Coagulation Profile: No results for input(s): INR, PROTIME in the last 168 hours. Cardiac Enzymes: No results for input(s): CKTOTAL, CKMB, CKMBINDEX, TROPONINI in the last 168 hours. BNP (last 3 results) No results for input(s): PROBNP in the last 8760 hours. HbA1C:  Recent Labs  08/05/16 0314  HGBA1C 5.3   CBG:  Recent Labs Lab 08/04/16 2231 08/05/16 0745 08/05/16 1700 08/05/16 2046  GLUCAP 167* 88 111* 115*   Lipid Profile: No results for input(s): CHOL, HDL, LDLCALC, TRIG, CHOLHDL, LDLDIRECT in the last 72 hours. Thyroid Function Tests: No results for input(s): TSH, T4TOTAL, FREET4, T3FREE, THYROIDAB in the last 72 hours. Anemia Panel:  Recent Labs  08/05/16 0314  VITAMINB12 386  FOLATE 7.4  FERRITIN 136  TIBC 291  IRON 25*  RETICCTPCT 1.4   Sepsis Labs: No results for input(s): PROCALCITON, LATICACIDVEN in the last 168 hours.  Recent Results (from the past 240 hour(s))  MRSA PCR Screening     Status: None   Collection Time: 08/05/16  3:19 AM  Result Value Ref Range Status   MRSA by PCR NEGATIVE NEGATIVE Final    Comment:        The GeneXpert MRSA Assay (FDA approved for NASAL specimens only), is one component of a comprehensive MRSA colonization surveillance program. It is not intended to diagnose MRSA infection nor to guide or monitor treatment for MRSA infections.          Radiology Studies: Dg Chest 2 View  Result Date: 08/04/2016 CLINICAL DATA:  Assess for hemorrhage. History of hypertension and stroke. EXAM: CHEST  2 VIEW COMPARISON:  Chest radiograph November 09, 2009 FINDINGS: Cardiomediastinal silhouette is normal. No pleural effusions or focal consolidations. Trachea projects midline and there is no pneumothorax. Soft tissue planes and included osseous structures are non-suspicious. Surgical clips project in the upper abdomen. IMPRESSION: Stable examination:  No acute  cardiopulmonary process. Electronically Signed   By: Awilda Metro M.D.   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 COMPARISON:  10/21/2012 FINDINGS: Brain: Old encephalomalacia of the left occipital lobe, posterior thalamus and posterior temporal lobes with ex vacuo dilatation of the left lateral ventricle. Old appearing lacunar infarct in pons but new since 2014. New but nonacute appearing lacunar infarcts within the bilateral basal ganglia. Extensive hypodensity is now visualized within the bilateral periventricular and subcortical white matter. Probable old lacunar infarcts within the anterior white matter. No hemorrhage. No mass. No midline shift. Slight interval enlargement  of the ventricles since the prior exam. Vascular: No hyperdense vessels. Scattered calcifications at the carotid siphons. Skull: No fracture or suspicious bone lesion Sinuses/Orbits: Mild mucosal thickening in the ethmoid sinuses. No acute orbital abnormality. Right lens extraction Other: None IMPRESSION: 1. No hemorrhage or mass lesion is visualized. 2. Old left posterior infarct with ex vacuo dilatation of the left lateral ventricle, slightly progressed since 2014. 3. Old appearing multifocal lacunar infarcts within the bilateral basal ganglia and pons, but new since 2014 4. Extensive hypodensity within the periventricular and subcortical white matter presumably due to progression of small vessel ischemic changes. This could potentially obscure acute edema ; MRI follow-up may be performed as clinically indicated. Electronically Signed   By: Jasmine Pang M.D.   On: 08/04/2016 22:30   Mr Brain Wo Contrast  Result Date: 08/05/2016 CLINICAL DATA:  Initial evaluation for hypertensive emergency  with right-sided weakness. Headache. EXAM: MRI HEAD WITHOUT CONTRAST TECHNIQUE: Multiplanar, multiecho pulse sequences of the brain and surrounding structures were obtained without intravenous contrast. COMPARISON:  Prior CT from 08/04/2016 and prior MRI from 09/07/2009. FINDINGS: Brain: Generalized age-related cerebral atrophy. Patchy and confluent T2/FLAIR hyperintensity within the periventricular and deep white matter both cerebral hemispheres most consistent with chronic small vessel ischemic disease, moderately advanced. Multiple remote lacunar infarcts present within the bilateral basal ganglia and pons. Scattered remote lacunar infarcts present within the periventricular white matter of the corona radiata bilaterally as well. Remote left PCA territory infarct with associated ex vacuo dilatation of the left lateral ventricle. Patchy restricted diffusion within the left lentiform nucleus, extending into the left caudate (series 3, image 30, 35), consistent with acute ischemic infarct. These measure up to 6 mm. No associated hemorrhage or mass effect. Few additional vague diffusion signal foci present within the deep/subcortical white matter of the posterior right centrum semi ovale also suspicious for possible small vessel ischemic changes (series 3, image 35, 40). This is more subacute in appearance. No associated hemorrhage or mass effect. Mild diffusion abnormality also present within the mid left centrum semi ovale, also suspicious for possible subacute small vessel ischemia (series 3, image 38). No other evidence for acute intracranial infarct. Gray-white matter differentiation otherwise maintained. Incidental note made of a few scattered chronic micro hemorrhages, most notable within the left parietal lobe and left thalamus, likely due to chronic underlying hypertension. Chronic blood products also noted about the remote left PCA territory infarct. No mass lesion, midline shift or mass effect. Ex vacuo  dilatation left lateral ventricle related to left temporal occipital encephalomalacia. No hydrocephalus. No extra-axial fluid collection. Major dural sinuses are grossly patent. Pituitary gland suprasellar region within normal limits. Vascular: Major intracranial vascular flow voids are maintained. Skull and upper cervical spine: Craniocervical junction within normal limits. Visualized upper cervical spine within normal limits. Bone marrow signal intensity normal. No scalp soft tissue abnormality. Sinuses/Orbits: Globes and oval soft tissues within normal limits. Patient status post lens extraction on the right. Paranasal sinuses are clear. Trace left mastoid effusion. Inner ear structures normal. Other: None. IMPRESSION: 1. Patchy subcentimeter acute ischemic infarcts involving the left basal ganglia. No associated hemorrhage or mass effect. 2. Additional subcentimeter patchy diffusion signal abnormality within the right greater than left centrum semi ovale, most suggestive of subacute small vessel ischemic changes. 3. Remote left PCA territory infarct with associated ex vacuo dilatation of the left lateral ventricle. 4. Advanced chronic microvascular ischemic disease for patient age with scattered remote lacunar infarcts involving the bilateral basal ganglia and  pons. Electronically Signed   By: Rise Mu M.D.   On: 08/05/2016 03:09   US Renal  Result Date: 08/05/2016 CLINICAL DATA:  Acute kidney injury with elevated BUN and creatinine. Previous left nephrectomy. EXAM: RENAL / URINARY TRACT ULTRASOUND COMPLETE COMPARISON:  07/15/2011 FINDINGS: Right Kidney: Length: 11 cm. Echogenicity within normal limits. No mass or hydronephrosis visualized. Left Kidney: Left kidney is surgically absent. No mass or fluid collection in the left renal fossa. Bladder: No bladder wall thickening or filling defect. Bladder is not abnormally distended. Incidental note of enlarged prostate gland, measuring 3.6 x 3.2 x  3.5 cm. IMPRESSION: Normal ultrasound appearance of the right kidney. No hydronephrosis. Left kidney is surgically absent. Mild prostate enlargement. Electronically Signed   By: Burman Nieves M.D.   On: 08/05/2016 01:28        Scheduled Meds: . aspirin  300 mg Rectal Daily   Or  . aspirin  325 mg Oral Daily  . feeding supplement (ENSURE ENLIVE)  237 mL Oral BID BM   Continuous Infusions:   LOS: 1 day        Mauricio Annett Gula, MD [facility] Pager [phone]  If 7PM-7AM, please contact night-coverage [email or web address] Password [facility] 08/06/2016, 10:36 AM

## 2016-08-07 ENCOUNTER — Inpatient Hospital Stay (HOSPITAL_COMMUNITY): Payer: Medicaid Other

## 2016-08-07 ENCOUNTER — Encounter (HOSPITAL_COMMUNITY): Payer: Self-pay | Admitting: Physical Medicine and Rehabilitation

## 2016-08-07 DIAGNOSIS — N184 Chronic kidney disease, stage 4 (severe): Secondary | ICD-10-CM

## 2016-08-07 DIAGNOSIS — G8191 Hemiplegia, unspecified affecting right dominant side: Secondary | ICD-10-CM

## 2016-08-07 DIAGNOSIS — I161 Hypertensive emergency: Secondary | ICD-10-CM

## 2016-08-07 DIAGNOSIS — I639 Cerebral infarction, unspecified: Secondary | ICD-10-CM

## 2016-08-07 LAB — ECHOCARDIOGRAM COMPLETE
Height: 68 in
WEIGHTICAEL: 2702.4 [oz_av]

## 2016-08-07 LAB — BASIC METABOLIC PANEL
ANION GAP: 10 (ref 5–15)
BUN: 28 mg/dL — ABNORMAL HIGH (ref 6–20)
CHLORIDE: 101 mmol/L (ref 101–111)
CO2: 22 mmol/L (ref 22–32)
Calcium: 8.3 mg/dL — ABNORMAL LOW (ref 8.9–10.3)
Creatinine, Ser: 2.97 mg/dL — ABNORMAL HIGH (ref 0.61–1.24)
GFR calc non Af Amer: 22 mL/min — ABNORMAL LOW (ref >60)
GFR, EST AFRICAN AMERICAN: 25 mL/min — AB (ref >60)
Glucose, Bld: 92 mg/dL (ref 65–99)
POTASSIUM: 2.8 mmol/L — AB (ref 3.5–5.1)
Sodium: 133 mmol/L — ABNORMAL LOW (ref 135–145)

## 2016-08-07 LAB — MAGNESIUM: Magnesium: 1.6 mg/dL — ABNORMAL LOW (ref 1.7–2.4)

## 2016-08-07 MED ORDER — POTASSIUM CHLORIDE CRYS ER 20 MEQ PO TBCR
20.0000 meq | EXTENDED_RELEASE_TABLET | Freq: Every day | ORAL | Status: DC
  Administered 2016-08-08: 20 meq via ORAL
  Filled 2016-08-07: qty 1

## 2016-08-07 MED ORDER — FERROUS SULFATE 325 (65 FE) MG PO TABS: 325.0000 mg | ORAL_TABLET | Freq: Two times a day (BID) | ORAL | Status: DC

## 2016-08-07 MED ORDER — POTASSIUM CHLORIDE CRYS ER 20 MEQ PO TBCR
40.0000 meq | EXTENDED_RELEASE_TABLET | Freq: Two times a day (BID) | ORAL | Status: DC
  Administered 2016-08-07: 40 meq via ORAL
  Filled 2016-08-07: qty 2

## 2016-08-07 MED ORDER — METOPROLOL TARTRATE 12.5 MG HALF TABLET
12.5000 mg | ORAL_TABLET | Freq: Two times a day (BID) | ORAL | Status: DC
  Administered 2016-08-07 – 2016-08-08 (×3): 12.5 mg via ORAL
  Filled 2016-08-07 (×3): qty 1

## 2016-08-07 MED ORDER — MAGNESIUM SULFATE 2 GM/50ML IV SOLN
2.0000 g | Freq: Once | INTRAVENOUS | Status: AC
  Administered 2016-08-07: 2 g via INTRAVENOUS
  Filled 2016-08-07: qty 50

## 2016-08-07 MED ORDER — ENOXAPARIN SODIUM 30 MG/0.3ML ~~LOC~~ SOLN
30.0000 mg | SUBCUTANEOUS | Status: DC
  Administered 2016-08-07: 30 mg via SUBCUTANEOUS
  Filled 2016-08-07: qty 0.3

## 2016-08-07 MED ORDER — SODIUM CHLORIDE 0.9 % IV SOLN
30.0000 meq | Freq: Once | INTRAVENOUS | Status: AC
  Administered 2016-08-07: 30 meq via INTRAVENOUS
  Filled 2016-08-07: qty 15

## 2016-08-07 NOTE — Progress Notes (Signed)
*  PRELIMINARY RESULTS* Vascular Ultrasound Carotid Duplex (Doppler) has been completed.  Preliminary findings: Bilateral: No significant (1-39%) ICA stenosis. Antegrade vertebral flow.   TCD completed.   Farrel Demark, RDMS, RVT  08/07/2016, 3:16 PM

## 2016-08-07 NOTE — Progress Notes (Signed)
STROKE TEAM PROGRESS NOTE   SUBJECTIVE (INTERVAL HISTORY) Patient is stating he has no neurological complaints today. He is going to rehab today OBJECTIVE Temp:  [98 F (36.7 C)-98.7 F (37.1 C)] 98.7 F (37.1 C) (04/19 1100) Pulse Rate:  [90-116] 101 (04/19 1100) Cardiac Rhythm: Sinus tachycardia (04/19 0800) Resp:  [14-21] 18 (04/19 1100) BP: (159-189)/(93-126) 175/124 (04/19 1100) SpO2:  [96 %-99 %] 99 % (04/19 1100) Weight:  [168 lb 14.4 oz (76.6 kg)] 168 lb 14.4 oz (76.6 kg) (04/19 0251)  CBC:   Recent Labs Lab 08/04/16 2140 08/05/16 0314  WBC 11.6* 10.0  NEUTROABS 10.1*  --   HGB 10.3* 10.5*  HCT 30.8* 31.3*  MCV 83.9 84.1  PLT 201 201    Basic Metabolic Panel:   Recent Labs Lab 08/05/16 0314 08/07/16 0302  NA 137 133*  K 3.1* 2.8*  CL 100* 101  CO2 24 22  GLUCOSE 138* 92  BUN 22* 28*  CREATININE 2.90* 2.97*  CALCIUM 8.8* 8.3*  MG  --  1.6*    PHYSICAL EXAM Frail middle-aged African-American male not in distress.  . Afebrile. Head is nontraumatic. Neck is supple without bruit.    Cardiac exam no murmur or gallop. Lungs are clear to auscultation. Distal pulses are well felt. Neurological Exam :  Awake alert oriented 2. Cannot tell me the month of the year. Follows midline and one-step commands only. No aphasia or dysarthria. Extraocular moments are full range without nystagmus. Dense right homonymous hemianopsia. Fundi were not visualized. Face is symmetric without weakness. Tongue midline. Motor system exam no upper or lower extremity drift but significant weakness of right grip and intrinsic hand muscles with non-fixed finger flexion contractures. Mild spasticity in the right hand. Minimum weakness of right hip flexors and ankle dorsiflexors only. Deep tendon reflexes are brisker on the right compared to the left. Right plantar equivocal left downgoing. Coordination is impaired on the right. Sensation is intact. Gait was not tested. ASSESSMENT/PLAN Mr.  Ismar Yabut is a 56 y.o. male with history of prior stroke, HTN, CKD, medication noncompliance presenting with generalized weakness, nausea and vomiting.  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   Stroke:  patchy left BG infarcts secondary to small vessel disease source  CT no hmg. Old L posteior infarct w/ dilation of L lateral ventricle. Old B BG and pontine lacunes. Extensive PV and subcortical WM small vessel disease.   MRI  Patchy L basal ganglia. R >L CSV small vessel disease. Old L PCA infarct w/ dilation of L lateral ventricle. small vessel disease. Scattered old lacunes B BG and pons  Carotid Doppler  pending  2D Echo  Left ventricle: The cavity size was normal. Wall thickness was   increased in a pattern of moderate LVH. Systolic function was   vigorous. The estimated ejection fraction was in the range of 65%   to 70%. Wall motion was normal; there were no regional wall    motion abnormalities.  TCD pending   TEE 08/2009 no PFO, source of embolus  LDL 130  HgbA1c 5.3  SCDs for VTE prophylaxis  Diet Heart Room service appropriate? Yes; Fluid consistency: Thin  No antithrombotic prior to admission, now on aspirin 325 mg daily  Therapy recommendations:  CIR.  Disposition:  SANE Consult   Hypertensive Urgency  SBP > 200 on arrival (205/137)  Remains elevated              Renal US normal R  kidney. Surgically absent L kidney              From stroke standpoint, Permissive hypertension (OK if < 220/120) but gradually normalize in 5-7 days              Long-term BP goal normotensive  Hyperlipidemia  Home meds:  No statin  LDL 130, above goal  Add statin  Continue statin at discharge  Other Stroke Risk Factors  UDS / ETOH screen negative   Hx stroke/TIA  5/20177 L PCA infarct, embolic, no source found (TEE neg). D/c on coumadin d/t LLE DVT found.  Other Active Problems  AKI on CKD 3 vs progressive CKD  Anemia, d/t renal  dz  Hypokalemia   Hospital day # 2    Continue   aspirin for stroke prevention and aggressive risk factor modification and continue ongoing stroke workup. Discussed with Dr.Bhandari. Transfer to inpatient rehabilitation. Return for follow-up in stroke clinic in 6 weeks or call earlier if necessary  Delia Heady, MD Medical Director [facility] Pager: [phone] 08/07/2016 2:00 PM  To contact Stroke Continuity provider, please refer to [email or web address]. After hours, contact General Neurology

## 2016-08-07 NOTE — Progress Notes (Addendum)
Inpatient Rehabilitation  Met with patient to discuss team's recommendation for IP Rehab.  Shared booklets and answered questions.  Patient unsure of rehab and stated that he may want to just go home.  Note that dopplers are to be completed and transport arrived at end of session.  Will follow up with patient tomorrow in hopes of patient decision and bed availability.  Please call with questions.   Carmelia Roller., CCC/SLP Admission Coordinator  [place]  Cell [phone]

## 2016-08-07 NOTE — Progress Notes (Signed)
Rehab Admissions Coordinator Note:  Patient was screened by Clois Dupes for appropriateness for an Inpatient Acute Rehab Consult per PT recommendation.   At this time, we are recommending Inpatient Rehab consult. Please place order.  Clois Dupes 08/07/2016, 8:00 AM  I can be reached at [phone].

## 2016-08-07 NOTE — Consult Note (Signed)
Physical Medicine and Rehabilitation Consult   Reason for Consult: Weakness Referring Physician: Dr. Ronalee Belts   HPI: Matthew Guerra is a 56 y.o. 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 MRI brain done revealing "patchy subcentimeter acute ischemic infarcts involving the left basal ganglia, additional sub centimeter diffusion abnormality right > left centrum ovale suggestive of subacute small vessel ischemic changes and advanced small vessel disease" Renal ultrasound done due to AKI and showed normal right kidney and surgically absent left kidney.  Dr. Pearlean Brownie felt stroke due to small vessel disease and recommended ASA for secondary stroke prevention. PT evaluation done revealing deficits in mobility and ability to carry out self care tasks. CIR recommended for follow up therapy.    Review of Systems  Constitutional: Negative for chills and fever.  HENT: Negative for ear pain, hearing loss and tinnitus.   Eyes: Positive for blurred vision (poor vision).  Respiratory: Negative for cough and shortness of breath.   Cardiovascular: Negative for chest pain and palpitations.  Gastrointestinal: Positive for constipation. Negative for diarrhea, heartburn, nausea and vomiting.  Genitourinary: Negative for frequency and urgency.  Musculoskeletal: Negative for back pain, joint pain and myalgias.  Neurological: Positive for focal weakness. Negative for dizziness and headaches.  Psychiatric/Behavioral: Positive for memory loss. The patient does not have insomnia.     Past Medical History:  Diagnosis Date  . Hypertension   . Stroke [facility]     Surgical history: "surgery on left side" --likely Left nephrectomy   Family History  Problem Relation Age of Onset  . Diabetes Mother      Social  History:  Lives with a friend. He smokes cigars -- 1-2 day. He has never used smokeless tobacco. He reports that he drinks liquor-- couple of shots of liquor once a week. His drug history is not on file.    Allergies: No Known Allergies    No prescriptions prior to admission.    Home: Home Living Family/patient expects to be discharged to:: Private residence Living Arrangements: Non-relatives/Friends Available Help at Discharge: Friend(s) Type of Home: Apartment Home Access: Level entry Home Layout: One level Bathroom Shower/Tub: Health visitor: Standard Bathroom Accessibility: Yes Home Equipment: Cane - single point, Environmental consultant - 2 wheels  Functional History: Prior Function Level of Independence: Independent Comments: prior RUE deficits and R field cut from previous CVA Functional Status:  Mobility: Bed Mobility Overal bed mobility: Needs Assistance, Modified Independent Bed Mobility: Supine to Sit, Sit to Supine Supine to sit: Modified independent (Device/Increase time) General bed mobility comments: Good technique increased time to complete.   Transfers Overall transfer level: Needs assistance Equipment used: None Transfers: Sit to/from Stand, [employer] Transfers Sit to Stand: Min guard, Min assist Stand pivot transfers: Mod assist General transfer comment: Pt required assist to steady in standing.  No assist needed to rise but relying on leaning against bed with back side of Lower extremities to compensate.   Ambulation/Gait Ambulation/Gait assistance: Min guard, Min assist Ambulation Distance (Feet): 80 Feet (+40 ft gait distance limited due to elevated HR 120-150s bpm during gait.  ) Assistive device: 1 person hand held assist, Rolling walker (2 wheeled) Gait Pattern/deviations: Step-through pattern, Wide base of support, Decreased stride length General Gait Details: Pt remains with lateral lean to the L.  NO AD  used with mild LOB required min assist to  correct.  Pt unaware of his deficits and asymptomatic to elevation of HR    ADL: ADL Overall ADL's : Needs assistance/impaired Grooming: Wash/dry face, Wash/dry hands, Min guard, Standing Upper Body Bathing: Supervision/ safety, Set up, Sitting Lower Body Bathing: Minimal assistance, Sit to/from stand Upper Body Dressing : Supervision/safety, Set up, Sitting Lower Body Dressing: Minimal assistance, Sit to/from stand Toilet Transfer: Minimal assistance, Ambulation Toileting- Clothing Manipulation and Hygiene: Minimal assistance, Sit to/from stand Functional mobility during ADLs: Moderate assistance General ADL Comments: Pt with poor safety awareness. Impulsive. Reqrueid mod A to prevent fall during funcitonal mobility without using AD (states this is his baseline).   Cognition: Cognition Overall Cognitive Status: No family/caregiver present to determine baseline cognitive functioning Orientation Level: Oriented X4 Cognition Arousal/Alertness: Awake/alert Behavior During Therapy: WFL for tasks assessed/performed Overall Cognitive Status: No family/caregiver present to determine baseline cognitive functioning General Comments: impaired safty/judgement and awareness of deficits  Blood pressure (!) 163/116, pulse 97, temperature 98.5 F (36.9 C), temperature source Oral, resp. rate 16, height  (1.727 m), weight 76.6 kg (168 lb 14.4 oz), SpO2 98 %. Physical Exam  Nursing note and vitals reviewed. Constitutional: He appears well-developed and well-nourished.  HENT:  Head: Normocephalic and atraumatic.  Mouth/Throat: Oropharynx is clear and moist.  Eyes: Conjunctivae are normal. Pupils are equal, round, and reactive to light.  Unable to move eyes to left field  Neck: Normal range of motion. Neck supple.  Cardiovascular: Regular rhythm.  Tachycardia present.   Respiratory: Effort normal and breath sounds normal. No stridor. No respiratory distress. He has no wheezes.  GI: Soft.  Bowel sounds are normal. He exhibits no distension. There is no tenderness.  Musculoskeletal: He exhibits no edema or tenderness.  Neurological: He is alert.  Mild dysarthria. Unable to move right eye to left field. Oriented to self and place. Unable to recall reason for admission without cues. Unable to recall month, address or medical information without cues. Right sided weakness with sensory deficits and apraxia.   Skin: Skin is warm and dry.  Psychiatric: He has a normal mood and affect. His behavior is normal. Cognition and memory are impaired. He expresses inappropriate judgment. He exhibits abnormal recent memory and abnormal remote memory.    Results for orders placed or performed during the hospital encounter of 08/04/16 (from the past 24 hour(s))  Lipid panel     Status: Abnormal   Collection Time: 08/06/16 10:44 AM  Result Value Ref Range   Cholesterol 200 0 - 200 mg/dL   Triglycerides 161 (H) <150 mg/dL   HDL 33 (L) >09 mg/dL   Total CHOL/HDL Ratio 6.1 RATIO   VLDL 37 0 - 40 mg/dL   LDL Cholesterol 604 (H) 0 - 99 mg/dL  Glucose, capillary     Status: Abnormal   Collection Time: 08/06/16  8:34 PM  Result Value Ref Range   Glucose-Capillary 119 (H) 65 - 99 mg/dL  Basic metabolic panel     Status: Abnormal   Collection Time: 08/07/16  3:02 AM  Result Value Ref Range   Sodium 133 (L) 135 - 145 mmol/L   Potassium 2.8 (L) 3.5 - 5.1 mmol/L   Chloride 101 101 - 111 mmol/L   CO2 22 22 - 32 mmol/L   Glucose, Bld 92 65 - 99 mg/dL   BUN 28 (H) 6 - 20 mg/dL   Creatinine, Ser 5.40 (H) 0.61 - 1.24 mg/dL   Calcium 8.3 (  L) 8.9 - 10.3 mg/dL   GFR calc non Af Amer 22 (L) >60 mL/min   GFR calc Af Amer 25 (L) >60 mL/min   Anion gap 10 5 - 15  Magnesium     Status: Abnormal   Collection Time: 08/07/16  3:02 AM  Result Value Ref Range   Magnesium 1.6 (L) 1.7 - 2.4 mg/dL   No results found.  Assessment/Plan: Diagnosis: left basal ganglia infarcts due to SVD with right HP. Prior left  PCA infarct 1. Does the need for close, 24 hr/day medical supervision in concert with the patient's rehab needs make it unreasonable for this patient to be served in a less intensive setting? Yes 2. Co-Morbidities requiring supervision/potential complications: CKD, HTN 3. Due to bladder management, bowel management, safety, skin/wound care, disease management, medication administration, pain management and patient education, does the patient require 24 hr/day rehab nursing? Yes 4. Does the patient require coordinated care of a physician, rehab nurse, PT (1-2 hrs/day, 5 days/week), OT (1-2 hrs/day, 5 days/week) and SLP (1-2 hrs/day, 5 days/week) to address physical and functional deficits in the context of the above medical diagnosis(es)? Yes Addressing deficits in the following areas: balance, endurance, locomotion, strength, transferring, bowel/bladder control, bathing, dressing, feeding, grooming, toileting, cognition, speech, swallowing and psychosocial support 5. Can the patient actively participate in an intensive therapy program of at least 3 hrs of therapy per day at least 5 days per week? Yes 6. The potential for patient to make measurable gains while on inpatient rehab is good 7. Anticipated functional outcomes upon discharge from inpatient rehab are modified independent  with PT, modified independent and supervision with OT, modified independent with SLP. 8. Estimated rehab length of stay to reach the above functional goals is: 8-12 days 9. Does the patient have adequate social supports and living environment to accommodate these discharge functional goals? Yes 10. Anticipated D/C setting: Home 11. Anticipated post D/C treatments: HH therapy and Outpatient therapy 12. Overall Rehab/Functional Prognosis: excellent  RECOMMENDATIONS: This patient's condition is appropriate for continued rehabilitative care in the following setting: CIR Patient has agreed to participate in recommended program.  Yes Note that insurance prior authorization may be required for reimbursement for recommended care.  Comment: Rehab Admissions Coordinator to follow up.  Thanks,  Ranelle Oyster, MD, Earlie Counts, PA-C 08/07/2016

## 2016-08-07 NOTE — Progress Notes (Signed)
PROGRESS NOTE    Matthew Guerra  ZOX:[id number] DOB: 07/11/1960 DOA: 08/04/2016 PCP: No PCP Per Patient   Brief Narrative: 56 y.o. gentleman with a history of multiple prior strokes, HTN, chronic kidney disease, medical noncompliance who presents to the ED for evaluation of generalized weakness with nausea and vomiting.  Reportedly, the patient was also mildly disoriented upon arrival as well. In the ER patient was found to have systolic blood pressure more than 220. Head CT consistent with multiple old infarction. Patient was admitted for further evaluation.  Assessment & Plan:  #  Acute ischemic stroke:  -MRI of the brain consistent with acute ischemic infarction of left basal ganglia, also with old left PCA territory infarction -Echocardiogram with EF of 65-70% with normal wall motion. No cardiac source of emboli -Continue aspirin, Lipitor -A1c of 5.3., LDL 1:30 -Follow-up carotid Doppler -Neurology following -PT OT recommended she CIR, CIN consult obtained.  #Hypertensive emergency: Blood pressure is better controlled. Currently on low-dose Norvasc. I will add low-dose beta blocker given vigorous left ventricular systolic function. Monitor blood pressure. Avoid hypotensive episode.  # CKD stage 4 likely in the setting of uncontrolled hypertension: I think patient has progressive security due to uncontrolled hypertension. Serum creatinine level is stable. Avoid nephrotoxins.  #Hypokalemia and hyponatremia: Repleted potassium chloride IV and oral. Has borderline low magnesium level. Would repeat magnesium and repeat lab in the morning.  # Normocytic anemia: Likely in the setting CKD and Iron deficiency anemia. No sign of bleeding. Start oral iron.   Principal Problem:   Hypertensive emergency Active Problems:   Right sided weakness     Anemia   H/O noncompliance with medical treatment, presenting hazards to health   CVA (cerebral vascular accident) (HCC)   DVT prophylaxis:  lovenox sq Code Status:full Family Communication: No family at bedside Disposition Plan: Likely discharge to rehabilitation in 1-2 days    Consultants:   Neurologist  rehab Procedures:echo, mri Antimicrobials:no  Subjective: Patient was seen and examined at bedside. Denied headache, dizziness, nausea, vomiting, chest pain or shortness of breath.  Objective: Vitals:   08/07/16 0251 08/07/16 0414 08/07/16 0809 08/07/16 1100  BP:  (!) 159/93 (!) 163/116 (!) 175/124  Pulse: (!) 109  97 (!) 101  Resp:  Temp: 98.3 F (36.8 C)  98.5 F (36.9 C) 98.7 F (37.1 C)  TempSrc: Oral  Oral Oral  SpO2: 96%  98% 99%  Weight: 76.6 kg (168 lb 14.4 oz)     Height:        Intake/Output Summary (Last 24 hours) at 08/07/16 1402 Last data filed at 08/07/16 1300  Gross per 24 hour  Intake           1224.9 ml  Output             1380 ml  Net           -155.1 ml   Filed Weights   08/05/16 0248 08/06/16 0426 08/07/16 0251  Weight: 75.3 kg (166 lb) 76.3 kg (168 lb 3.2 oz) 76.6 kg (168 lb 14.4 oz)    Examination:  General exam: Appears calm and comfortable  Respiratory system: Clear to auscultation. Respiratory effort normal. No wheezing or crackle Cardiovascular system: S1 & S2 heard, RRR.  No pedal edema. Gastrointestinal system: Abdomen is nondistended, soft and nontender. Normal bowel sounds heard. Central nervous system: Alert and oriented. No focal neurological deficits. Extremities: Symmetric 5 x 5 power. Skin: No rashes, lesions or ulcers Psychiatry:  Judgement and insight appear normal. Mood & affect appropriate.     Data Reviewed: I have personally reviewed following labs and imaging studies  CBC:  Recent Labs Lab 08/04/16 2140 08/05/16 0314  WBC 11.6* 10.0  NEUTROABS 10.1*  --   HGB 10.3* 10.5*  HCT 30.8* 31.3*  MCV 83.9 84.1  PLT 201 201   Basic Metabolic Panel:  Recent Labs Lab 08/04/16 2140 08/05/16 0314 08/07/16 0302  NA 134* 137 133*  K  3.0* 3.1* 2.8*  CL 100* 100* 101  CO2 GLUCOSE 202* 138* 92  BUN 22* 22* 28*  CREATININE 3.07* 2.90* 2.97*  CALCIUM 8.4* 8.8* 8.3*  MG  --   --  1.6*   GFR: Estimated Creatinine Clearance: 26.2 mL/min (A) (by C-G formula based on SCr of 2.97 mg/dL (H)). Liver Function Tests: No results for input(s): AST, ALT, ALKPHOS, BILITOT, PROT, ALBUMIN in the last 168 hours. No results for input(s): LIPASE, AMYLASE in the last 168 hours. No results for input(s): AMMONIA in the last 168 hours. Coagulation Profile: No results for input(s): INR, PROTIME in the last 168 hours. Cardiac Enzymes: No results for input(s): CKTOTAL, CKMB, CKMBINDEX, TROPONINI in the last 168 hours. BNP (last 3 results) No results for input(s): PROBNP in the last 8760 hours. HbA1C:  Recent Labs  08/05/16 0314  HGBA1C 5.3   CBG:  Recent Labs Lab 08/04/16 2231 08/05/16 0745 08/05/16 1700 08/05/16 2046 08/06/16 2034  GLUCAP 167* 88 111* 115* 119*   Lipid Profile:  Recent Labs  08/06/16 1044  CHOL 200  HDL 33*  LDLCALC 130*  TRIG 186*  CHOLHDL 6.1   Thyroid Function Tests: No results for input(s): TSH, T4TOTAL, FREET4, T3FREE, THYROIDAB in the last 72 hours. Anemia Panel:  Recent Labs  08/05/16 0314  VITAMINB12 386  FOLATE 7.4  FERRITIN 136  TIBC 291  IRON 25*  RETICCTPCT 1.4   Sepsis Labs: No results for input(s): PROCALCITON, LATICACIDVEN in the last 168 hours.  Recent Results (from the past 240 hour(s))  MRSA PCR Screening     Status: None   Collection Time: 08/05/16  3:19 AM  Result Value Ref Range Status   MRSA by PCR NEGATIVE NEGATIVE Final    Comment:        The GeneXpert MRSA Assay (FDA approved for NASAL specimens only), is one component of a comprehensive MRSA colonization surveillance program. It is not intended to diagnose MRSA infection nor to guide or monitor treatment for MRSA infections.          Radiology Studies: No results  found.      Scheduled Meds: . amLODipine  5 mg Oral Daily  . aspirin  300 mg Rectal Daily   Or  . aspirin  325 mg Oral Daily  . atorvastatin  40 mg Oral q1800  . ferrous sulfate  325 mg Oral TID WC  . potassium chloride  40 mEq Oral BID   Continuous Infusions:   LOS: 2 days    Lizmarie Witters Jaynie Collins, MD [facility] Pager [phone]  If 7PM-7AM, please contact night-coverage [email or web address] Password [email or web address] 08/07/2016, 2:02 PM

## 2016-08-08 LAB — VAS US CAROTID
LCCADDIAS: -21 cm/s
LCCAPSYS: 74 cm/s
LEFT ECA DIAS: -14 cm/s
LEFT VERTEBRAL DIAS: 24 cm/s
Left CCA dist sys: -84 cm/s
Left CCA prox dias: 16 cm/s
Left ICA dist dias: -28 cm/s
Left ICA dist sys: -78 cm/s
Left ICA prox dias: -21 cm/s
Left ICA prox sys: -63 cm/s
RCCADSYS: -55 cm/s
RIGHT ECA DIAS: -15 cm/s
RIGHT VERTEBRAL DIAS: 21 cm/s
Right CCA prox dias: 16 cm/s
Right CCA prox sys: 62 cm/s

## 2016-08-08 LAB — BASIC METABOLIC PANEL
Anion gap: 9 (ref 5–15)
BUN: 30 mg/dL — AB (ref 6–20)
CHLORIDE: 104 mmol/L (ref 101–111)
CO2: 24 mmol/L (ref 22–32)
Calcium: 8.4 mg/dL — ABNORMAL LOW (ref 8.9–10.3)
Creatinine, Ser: 3.15 mg/dL — ABNORMAL HIGH (ref 0.61–1.24)
GFR calc non Af Amer: 20 mL/min — ABNORMAL LOW (ref >60)
GFR, EST AFRICAN AMERICAN: 23 mL/min — AB (ref >60)
Glucose, Bld: 96 mg/dL (ref 65–99)
POTASSIUM: 3.7 mmol/L (ref 3.5–5.1)
SODIUM: 137 mmol/L (ref 135–145)

## 2016-08-08 LAB — MAGNESIUM: MAGNESIUM: 2.2 mg/dL (ref 1.7–2.4)

## 2016-08-08 MED ORDER — ATORVASTATIN CALCIUM 40 MG PO TABS: 40.0000 mg | ORAL_TABLET | Freq: Every day | ORAL | 0 refills | Status: AC

## 2016-08-08 MED ORDER — ASPIRIN 325 MG PO TABS: 325.0000 mg | ORAL_TABLET | Freq: Every day | ORAL | 0 refills | Status: AC

## 2016-08-08 MED ORDER — FERROUS SULFATE 325 (65 FE) MG PO TABS: 325.0000 mg | ORAL_TABLET | Freq: Two times a day (BID) | ORAL | 0 refills | Status: AC

## 2016-08-08 MED ORDER — METOPROLOL TARTRATE 25 MG PO TABS: 25.0000 mg | ORAL_TABLET | Freq: Two times a day (BID) | ORAL | 0 refills | Status: AC

## 2016-08-08 MED ORDER — METOPROLOL TARTRATE 25 MG PO TABS: 25.0000 mg | ORAL_TABLET | Freq: Two times a day (BID) | ORAL | Status: DC

## 2016-08-08 MED ORDER — AMLODIPINE BESYLATE 5 MG PO TABS: 5.0000 mg | ORAL_TABLET | Freq: Every day | ORAL | 0 refills | Status: AC

## 2016-08-08 NOTE — Discharge Instructions (Signed)
Ischemic Stroke An ischemic stroke is the sudden death of brain tissue. Blood carries oxygen to all areas of the body. This type of stroke happens when your blood does not flow to your brain like normal. Your brain cannot get the oxygen it needs. This is an emergency. It must be treated right away. Symptoms of a stroke usually happen all of a sudden. You may notice them when you wake up. They can include:  Weakness or loss of feeling in your face, arm, or leg. This often happens on one side of the body.  Trouble walking.  Trouble moving your arms or legs.  Loss of balance or coordination.  Feeling confused.  Trouble talking or understanding what people are saying.  Slurred speech.  Trouble seeing.  Seeing two of one object (double vision).  Feeling dizzy.  Feeling sick to your stomach (nauseous) and throwing up (vomiting).  A very bad headache for no reason. Get help as soon as any of these problems start. This is important. Some treatments work better if they are given right away. These include:  Aspirin.  Medicines to control blood pressure.  A shot (injection) of medicine to break up the blood clot.  Treatments given in the blood vessel (artery) to take out the clot or break it up. Other treatments may include:  Oxygen.  Fluids given through an IV tube.  Medicines to thin out your blood.  Procedures to help your blood flow better. What increases the risk? Certain things may make you more likely to have a stroke. Some of these are things that you can change, such as:  Being very overweight (obesity).  Smoking.  Taking birth control pills.  Not being active.  Drinking too much alcohol.  Using drugs. Other risk factors include:  High blood pressure.  High cholesterol.  Diabetes.  Heart disease.  Being African American, Native American, Hispanic, or [place] Native.  Being over age 60.  Family history of stroke.  Having had blood clots, stroke,  or warning stroke (transient ischemic attack, TIA) in the past.  Sickle cell disease.  Being a woman with a history of high blood pressure in pregnancy (preeclampsia).  Migraine headache.  Sleep apnea.  Having an irregular heartbeat (atrial fibrillation).  Long-term (chronic) diseases that cause soreness and swelling (inflammation).  Disorders that affect how your blood clots. Follow these instructions at home: Medicines  Take over-the-counter and prescription medicines only as told by your doctor.  If you were told to take aspirin or another medicine to thin your blood, take it exactly as told by your doctor.  Taking too much of the medicine can cause bleeding.  If you do not take enough, it may not work as well.  Know the side effects of your medicines. If you are taking a blood thinner, make sure you:  Hold pressure over any cuts for longer than usual.  Tell your dentist and other doctors that you take this medicine.  Avoid activities that may cause damage or injury to your body. Eating and drinking  Follow instructions from your doctor about what you cannot eat or drink.  Eat healthy foods.  If you have trouble with swallowing, do these things to avoid choking:  Take small bites when eating.  Eat foods that are soft or pureed. Safety  Follow instructions from your health care team about physical activity.  Use a walker or cane as told by your doctor.  Keep your home safe so you do not fall. This   not fall. This may include:  Having experts look at your home to make sure it is safe.  Putting grab bars in the bedroom and bathroom.  Using raised toilets.  Putting a seat in the shower. General instructions   Do not use any tobacco products.  Examples of these are cigarettes, chewing tobacco, and e-cigarettes.  If you need help quitting, ask your doctor.  Limit how much alcohol you drink. This means no more than 1 drink a day for nonpregnant women and 2 drinks a  day for men. One drink equals 12 oz of beer, 5 oz of wine, or 1 oz of hard liquor.  If you need help to stop using drugs or alcohol, ask your doctor to refer you to a program or specialist.  Stay active. Exercise as told by your doctor.  Keep all follow-up visits as told by your doctor. This is important. Get help right away if:  You suddenly:  Have weakness or loss of feeling in your face, arm, or leg.  Feel confused.  Have trouble talking or understanding what people are saying.  Have trouble seeing.  Have trouble walking.  Have trouble moving your arms or legs.  Feel dizzy.  Lose your balance or coordination.  Have a very bad headache and you do not know why.  You pass out (lose consciousness) or almost pass out.  You have jerky movements that you cannot control (seizure). These symptoms may be an emergency. Do not wait to see if the symptoms will go away. Get medical help right away. Call your local emergency services (911 in the [place] Do not drive yourself to the hospital. This information is not intended to replace advice given to you by your health care provider. Make sure you discuss any questions you have with your health care provider. Document Released: 03/27/2011 Document Revised: 09/18/2015 Document Reviewed: 07/04/2015 Elsevier Interactive Patient Education  2017 [employer].   Stroke Prevention Some health problems and behaviors may make it more likely for you to have a stroke. Below are ways to lessen your risk of having a stroke.  Be active for at least 30 minutes on most or all days.  Do not smoke. Try not to be around others who smoke.  Do not drink too much alcohol.  Do not have more than 2 drinks a day if you are a man.  Do not have more than 1 drink a day if you are a woman and are not pregnant.  Eat healthy foods, such as fruits and vegetables. If you were put on a specific diet, follow the diet as told.  Keep your cholesterol levels  under control through diet and medicines. Look for foods that are low in saturated fat, trans fat, cholesterol, and are high in fiber.  If you have diabetes, follow all diet plans and take your medicine as told.  Ask your doctor if you need treatment to lower your blood pressure. If you have high blood pressure (hypertension), follow all diet plans and take your medicine as told by your doctor.  If you are 27-30 years old, have your blood pressure checked every 3-5 years. If you are age 22 or older, have your blood pressure checked every year.  Keep a healthy weight. Eat foods that are low in calories, salt, saturated fat, trans fat, and cholesterol.  Do not take drugs.  Avoid birth control pills, if this applies. Talk to your doctor about the risks of taking birth control pills.  Talk to your doctor if you have sleep problems (sleep apnea).  Take all medicine as told by your doctor.  You may be told to take aspirin or blood thinner medicine. Take this medicine as told by your doctor.  Understand your medicine instructions.  Make sure any other conditions you have are being taken care of. Get help right away if:  You suddenly lose feeling (you feel numb) or have weakness in your face, arm, or leg.  Your face or eyelid hangs down to one side.  You suddenly feel confused.  You have trouble talking (aphasia) or understanding what people are saying.  You suddenly have trouble seeing in one or both eyes.  You suddenly have trouble walking.  You are dizzy.  You lose your balance or your movements are clumsy (uncoordinated).  You suddenly have a very bad headache and you do not know the cause.  You have new chest pain.  Your heart feels like it is fluttering or skipping a beat (irregular heartbeat). Do not wait to see if the symptoms above go away. Get help right away. Call your local emergency services (911 in [place] Do not drive yourself to the hospital. This information is  not intended to replace advice given to you by your health care provider. Make sure you discuss any questions you have with your health care provider. Document Released: 10/07/2011 Document Revised: 09/13/2015 Document Reviewed: 10/08/2012 Elsevier Interactive Patient Education  2017 [employer]   Heart-Healthy Eating Plan Heart-healthy meal planning includes:  Limiting unhealthy fats.  Increasing healthy fats.  Making other small dietary changes. You may need to talk with your doctor or a diet specialist (dietitian) to create an eating plan that is right for you. What types of fat should I choose?  Choose healthy fats. These include olive oil and canola oil, flaxseeds, walnuts, almonds, and seeds.  Eat more omega-3 fats. These include salmon, mackerel, sardines, tuna, flaxseed oil, and ground flaxseeds. Try to eat fish at least twice each week.  Limit saturated fats.  Saturated fats are often found in animal products, such as meats, butter, and cream.  Plant sources of saturated fats include palm oil, palm kernel oil, and coconut oil.  Avoid foods with partially hydrogenated oils in them. These include stick margarine, some tub margarines, cookies, crackers, and other baked goods. These contain trans fats. What general guidelines do I need to follow?  Check food labels carefully. Identify foods with trans fats or high amounts of saturated fat.  Fill one half of your plate with vegetables and green salads. Eat 4-5 servings of vegetables per day. A serving of vegetables is:  1 cup of raw leafy vegetables.   cup of raw or cooked cut-up vegetables.   cup of vegetable juice.  Fill one fourth of your plate with whole grains. Look for the word "whole" as the first word in the ingredient list.  Fill one fourth of your plate with lean protein foods.  Eat 4-5 servings of fruit per day. A serving of fruit is:  One medium whole fruit.   cup of dried fruit.   cup of fresh,  frozen, or canned fruit.   cup of 100% fruit juice.  Eat more foods that contain soluble fiber. These include apples, broccoli, carrots, beans, peas, and barley. Try to get 20-30 g of fiber per day.  Eat more home-cooked food. Eat less restaurant, buffet, and fast food.  Limit or avoid alcohol.  Limit foods high in starch and  sugar.  Avoid fried foods.  Avoid frying your food. Try baking, boiling, grilling, or broiling it instead. You can also reduce fat by:  Removing the skin from poultry.  Removing all visible fats from meats.  Skimming the fat off of stews, soups, and gravies before serving them.  Steaming vegetables in water or broth.  Lose weight if you are overweight.  Eat 4-5 servings of nuts, legumes, and seeds per week:  One serving of dried beans or legumes equals  cup after being cooked.  One serving of nuts equals 1 ounces.  One serving of seeds equals  ounce or one tablespoon.  You may need to keep track of how much salt or sodium you eat. This is especially true if you have high blood pressure. Talk with your doctor or dietitian to get more information. What foods can I eat? Grains  Breads, including [place], white, pita, wheat, raisin, rye, oatmeal, and [place]. Tortillas that are neither fried nor made with lard or trans fat. Low-fat rolls, including hotdog and hamburger buns and English muffins. Biscuits. Muffins. Waffles. Pancakes. Light popcorn. Whole-grain cereals. Flatbread. Melba toast. Pretzels. Breadsticks. Rusks. Low-fat snacks. Low-fat crackers, including oyster, saltine, matzo, graham, animal, and rye. Rice and pasta, including brown rice and pastas that are made with whole wheat. Vegetables  All vegetables. Fruits  All fruits, but limit coconut. Meats and Other Protein Sources  Lean, well-trimmed beef, veal, pork, and lamb. Chicken and [place] without skin. All fish and shellfish. Wild duck, rabbit, pheasant, and venison. Egg whites or  low-cholesterol egg substitutes. Dried beans, peas, lentils, and tofu. Seeds and most nuts. Dairy  Low-fat or nonfat cheeses, including ricotta, string, and mozzarella. Skim or 1% milk that is liquid, powdered, or evaporated. Buttermilk that is made with low-fat milk. Nonfat or low-fat yogurt. Beverages  Mineral water. Diet carbonated beverages. Sweets and Desserts  Sherbets and fruit ices. Honey, jam, marmalade, jelly, and syrups. Meringues and gelatins. Pure sugar candy, such as hard candy, jelly beans, gumdrops, mints, marshmallows, and small amounts of dark chocolate. [employer]. Eat all sweets and desserts in moderation. Fats and Oils  Nonhydrogenated (trans-free) margarines. Vegetable oils, including soybean, sesame, sunflower, olive, peanut, safflower, corn, canola, and cottonseed. Salad dressings or mayonnaise made with a vegetable oil. Limit added fats and oils that you use for cooking, baking, salads, and as spreads. Other  Cocoa powder. Coffee and tea. All seasonings and condiments. The items listed above may not be a complete list of recommended foods or beverages. Contact your dietitian for more options.  What foods are not recommended? Grains  Breads that are made with saturated or trans fats, oils, or whole milk. Croissants. Butter rolls. Cheese breads. Sweet rolls. Donuts. Buttered popcorn. Chow mein noodles. High-fat crackers, such as cheese or butter crackers. Meats and Other Protein Sources  Fatty meats, such as hotdogs, short ribs, sausage, spareribs, bacon, rib eye roast or steak, and mutton. High-fat deli meats, such as salami and bologna. Caviar. Domestic duck and goose. Organ meats, such as kidney, liver, sweetbreads, and heart. Dairy  Cream, sour cream, cream cheese, and creamed cottage cheese. Whole-milk cheeses, including blue (bleu), [address], [place], [place], [address], [place], [address], cheddar, [place], and [place]. Whole or 2% milk that is liquid, evaporated,  or condensed. Whole buttermilk. Cream sauce or high-fat cheese sauce. Yogurt that is made from whole milk. Beverages  Regular sodas and juice drinks with added sugar. Sweets and Desserts  Frosting. Pudding. Cookies. Cakes other than angel  food cake. Candy that has milk chocolate or white chocolate, hydrogenated fat, butter, coconut, or unknown ingredients. Buttered syrups. Full-fat ice cream or ice cream drinks. Fats and Oils  Gravy that has suet, meat fat, or shortening. Cocoa butter, hydrogenated oils, palm oil, coconut oil, palm kernel oil. These can often be found in baked products, candy, fried foods, nondairy creamers, and whipped toppings. Solid fats and shortenings, including bacon fat, salt pork, lard, and butter. Nondairy cream substitutes, such as coffee creamers and sour cream substitutes. Salad dressings that are made of unknown oils, cheese, or sour cream. The items listed above may not be a complete list of foods and beverages to avoid. Contact your dietitian for more information.  This information is not intended to replace advice given to you by your health care provider. Make sure you discuss any questions you have with your health care provider. Document Released: 10/07/2011 Document Revised: 09/13/2015 Document Reviewed: 09/29/2013 Elsevier Interactive Patient Education  2017 [employer].

## 2016-08-08 NOTE — Progress Notes (Signed)
Occupational Therapy Treatment Patient Details Name: Matthew Guerra MRN: [id number] DOB: 05/14/1960 Today's Date: 08/08/2016    History of present illness 56 year old with history of noncompliance with medication regimen and hypertension. Who presented with headaches and right-sided weakness as well as nausea and vomiting. Dx of HTN, acute infarct L basal ganglia.  PMH: CKD (stage 3); HTN; L PCA CVA.    OT comments  Pt continues to demonstrate decreased safety awareness and insight into his deficits during functional activities. Able to perform functional mobility and ADL at supervision level today. Per chart review pt refusing CIR and HH follow up. Will continue to follow acutely.   Follow Up Recommendations  Other (comment) (pt refusing CIR and HH follow up)    Equipment Recommendations  Tub/shower bench    Recommendations for Other Services      Precautions / Restrictions Precautions Precautions: Fall Precaution Comments: watch BP Restrictions Weight Bearing Restrictions: No       Mobility Bed Mobility Overal bed mobility: Modified Independent Bed Mobility: Supine to Sit;Sit to Supine     Supine to sit: Modified independent (Device/Increase time)     General bed mobility comments: with rail, good technique  Transfers Overall transfer level: Needs assistance Equipment used: None Transfers: Sit to/from Stand Sit to Stand: Supervision         General transfer comment: for safety, mild unsteadiness    Balance Overall balance assessment: Needs assistance   Sitting balance-Leahy Scale: Good       Standing balance-Leahy Scale: Good                             ADL either performed or assessed with clinical judgement   ADL Overall ADL's : Needs assistance/impaired     Grooming: Supervision/safety;Standing;Wash/dry hands               Lower Body Dressing: Supervision/safety;Sit to/from stand   Toilet Transfer:  Supervision/safety;Ambulation;Regular Toilet       Tub/ Engineer, structural: Supervision/safety;Tub transfer;Ambulation Tub/Shower Transfer Details (indicate cue type and reason): Simulated Functional mobility during ADLs: Supervision/safety General ADL Comments: Pt with decreased safety awareness; reports he will be home alone while roommate at work. Feels he is at baseline currently.     Vision       Perception     Praxis      Cognition Arousal/Alertness: Awake/alert Behavior During Therapy: WFL for tasks assessed/performed Overall Cognitive Status: Impaired/Different from baseline Area of Impairment: Safety/judgement                         Safety/Judgement: Decreased awareness of safety;Decreased awareness of deficits              Exercises     Shoulder Instructions       General Comments      Pertinent Vitals/ Pain       Pain Assessment: No/denies pain  Home Living                                          Prior Functioning/Environment              Frequency  Min 3X/week        Progress Toward Goals  OT Goals(current goals can now be found in the care plan section)  Progress towards OT goals: Progressing  toward goals  Acute Rehab OT Goals Patient Stated Goal: to go home OT Goal Formulation: With patient  Plan Discharge plan needs to be updated    Co-evaluation                 End of Session    OT Visit Diagnosis: Unsteadiness on feet (R26.81);Other abnormalities of gait and mobility (R26.89);Other symptoms and signs involving the nervous system (R29.898)   Activity Tolerance Patient tolerated treatment well   Patient Left in bed;with call bell/phone within reach;with bed alarm set   Nurse Communication          Time: 1610-9604 OT Time Calculation (min): 17 min  Charges: OT General Charges $OT Visit: 1 Procedure OT Treatments $Self Care/Home Management : 8-22 mins  [name], M.S.,  OTR/L Pager: [phone]   [name] 08/08/2016, 1:08 PM

## 2016-08-08 NOTE — Progress Notes (Signed)
Physical Therapy Treatment Patient Details Name: Matthew Guerra MRN: [id number] DOB: [birth year] Today's Date: 08/08/2016    History of Present Illness 56 year old with history of noncompliance with medication regimen and hypertension. Who presented with headaches and right-sided weakness as well as nausea and vomiting. Dx of HTN, acute infarct L basal ganglia.  PMH: CKD (stage 3); HTN; L PCA CVA.     PT Comments    Pt ambulated 250' without an assistive device, no loss of balance, HR 110 walking. Mobility significantly improved this session. Pt appears to be at baseline, PT goals met, will DC PT.   Follow Up Recommendations  No PT follow up     Equipment Recommendations  none    Recommendations for Other Services Rehab consult     Precautions / Restrictions Precautions Precautions: Fall Precaution Comments: watch BP Restrictions Weight Bearing Restrictions: No    Mobility  Bed Mobility Overal bed mobility: Modified Independent Bed Mobility: Supine to Sit;Sit to Supine     Supine to sit: Modified independent (Device/Increase time)     General bed mobility comments: with rail, good technique  Transfers Overall transfer level: Needs assistance Equipment used: None Transfers: Sit to/from Stand Sit to Stand: Independent         General transfer comment: steady, no LOB  Ambulation/Gait Ambulation/Gait assistance: Supervision Ambulation Distance (Feet): 250 Feet Assistive device: None Gait Pattern/deviations: Step-through pattern;Decreased step length - right   Gait velocity interpretation: at or above normal speed for age/gender General Gait Details: steady without AD, no LOB, no lateral lean today, HR 110 walking   Stairs            Wheelchair Mobility    Modified Rankin (Stroke Patients Only)       Balance     Sitting balance-Leahy Scale: Good       Standing balance-Leahy Scale: Good                              Cognition  Arousal/Alertness: Awake/alert Behavior During Therapy: WFL for tasks assessed/performed Overall Cognitive Status: Within Functional Limits for tasks assessed                                        Exercises      General Comments        Pertinent Vitals/Pain Pain Assessment: No/denies pain    Home Living                      Prior Function            PT Goals (current goals can now be found in the care plan section) Acute Rehab PT Goals Patient Stated Goal: to go home PT Goal Formulation: All assessment and education complete, DC therapy Time For Goal Achievement: 08/12/16 Progress towards PT goals: Goals met/education completed, patient discharged from PT    Frequency    Min 3X/week      PT Plan Current plan remains appropriate    Co-evaluation             End of Session Equipment Utilized During Treatment: Gait belt Activity Tolerance: Patient tolerated treatment well Patient left: with call bell/phone within reach;in bed;with bed alarm set Nurse Communication: Mobility status PT Visit Diagnosis: Other abnormalities of gait and mobility (R26.89);Muscle weakness (generalized) (M62.81)     Time:  [id number] PT Time Calculation (min) (ACUTE ONLY): 10 min  Charges:  $Gait Training: 8-22 mins                    G Codes:          Philomena Doheny 08/08/2016, 11:11 AM [phone]

## 2016-08-08 NOTE — Plan of Care (Signed)
Problem: Safety: Goal: Ability to remain free from injury will improve Outcome: Progressing Call light within reach, patient remains free from fall  Problem: Activity: Goal: Risk for activity intolerance will decrease Outcome: Progressing Patient moved with ease when standing up for standing weight

## 2016-08-08 NOTE — Progress Notes (Addendum)
I met with pt at bedside with his RN present. We discussed a brief inpt rehab admission  Which pt refuses. He lacks awareness of his deficits and wants to d.c home. He states he has a friend who can pick him up today. We have offered bed yesterday and today and beds are limited.  We will sign off. [phone]

## 2016-08-08 NOTE — Discharge Summary (Signed)
Physician Discharge Summary  Mithran Strike ZOX:[id number] DOB: Sep 09, 1960 DOA: 08/04/2016  PCP: No PCP Per Patient  Admit date: 08/04/2016 Discharge date: 08/08/2016  Admitted From:Home Disposition:Home with home care (declined rehab, CIR admission)  Recommendations for Outpatient Follow-up:  1. Follow up with PCP in 1-2 weeks 2. Please obtain BMP/CBC in one week   Home Health: yes Equipment/Devices:rolling walker Discharge Condition:stable CODE STATUS:full Diet recommendation:heart healthy  Brief/Interim Summary: 56 y.o.gentleman with a history of multiple prior strokes, HTN, chronic kidney disease, medical noncompliance who presents to the ED for evaluation of generalized weakness with nausea and vomiting. Reportedly, the patient was also mildly disoriented upon arrival as well. In the ER patient was found to have systolic blood pressure more than 220. Head CT consistent with multiple old infarction. Patient was admitted for further evaluation.  #  Acute ischemic stroke:  -MRI of the brain consistent with acute ischemic infarction of left basal ganglia, also with old left PCA territory infarction -Echocardiogram with EF of 65-70% with normal wall motion. No cardiac source of emboli -started aspirin, Lipitor -A1c of 5.3., LDL 130 -Carotid Doppler with no critical stenosis -Neurology evaluated the patient. Recommended outpatient follow-up. -PT OT recommended  CIR. patient declined going to rehabilitation or skilled nursing facility. He reported that he lives with his friend and wanted to go home. I also discussed with him regarding primary care follow-up. Patient reported that he has PCP but hasn't seen PCP recently. He knows to call to make appointment for PCP visit in 1-2 weeks.  #Hypertensive emergency: Blood pressure is better controlled. On Norvasc and increase the dose of metoprolol to 25 twice a day today. His blood pressure expects to improve in next few days. Recommended to  monitor blood pressure at home.  # CKD stage 4 likely in the setting of uncontrolled hypertension: I think patient has progressive CKD due to uncontrolled hypertension. Serum creatinine level is stable. Avoid nephrotoxins. I recommended patient to discuss with his PCP regarding nephrology follow-up.   #Hypokalemia and hyponatremia: encourage oral intake. Recommended to monitor labs outpatient in 1-2 weeks.   # Normocytic anemia: Likely in the setting CKD and Iron deficiency anemia. No sign of bleeding. Started oral iron.  Patient is clinically improved. He has no weakness. Wanted to go home today. Declined rehabilitation. Verbalized understanding of follow-up instruction on medication. Evaluated by neurologist. At this time patient is medically stable to transfer his care to outpatient.   Discharge Diagnoses:  Principal Problem:   Hypertensive emergency Active Problems:   Right sided weakness   CKD (chronic kidney disease), stage III   Anemia   H/O noncompliance with medical treatment, presenting hazards to health   CVA (cerebral vascular accident) (HCC)   CKD (chronic kidney disease), stage IV [facility])    Discharge Instructions  Discharge Instructions    Ambulatory referral to Neurology    Complete by:  As directed    An appointment is requested in approximately: 4 weeks   Call MD for:  difficulty breathing, headache or visual disturbances    Complete by:  As directed    Call MD for:  extreme fatigue    Complete by:  As directed    Call MD for:  hives    Complete by:  As directed    Call MD for:  persistant dizziness or light-headedness    Complete by:  As directed    Call MD for:  persistant nausea and vomiting    Complete by:  As directed  Call MD for:  severe uncontrolled pain    Complete by:  As directed    Call MD for:  temperature >100.4    Complete by:  As directed    Diet - low sodium heart healthy    Complete by:  As directed    Increase activity slowly     Complete by:  As directed    Walker rolling    Complete by:  As directed      Allergies as of 08/08/2016   No Known Allergies     Medication List    TAKE these medications   amLODipine 5 MG tablet Commonly known as:  NORVASC Take 1 tablet (5 mg total) by mouth daily. Start taking on:  08/09/2016   aspirin 325 MG tablet Take 1 tablet (325 mg total) by mouth daily. Start taking on:  08/09/2016   atorvastatin 40 MG tablet Commonly known as:  LIPITOR Take 1 tablet (40 mg total) by mouth daily at 6 PM.   ferrous sulfate 325 (65 FE) MG tablet Take 1 tablet (325 mg total) by mouth 2 (two) times daily with a meal.   metoprolol tartrate 25 MG tablet Commonly known as:  LOPRESSOR Take 1 tablet (25 mg total) by mouth 2 (two) times daily.      Follow-up Information    [facility]. Schedule an appointment as soon as possible for a visit in 1 week(s).   Contact information: [address] [place] [address] [phone]         No Known Allergies  Consultations: Neurologist  Procedures/Studies:  echo, carotid Doppler, MRI  Subjective:  patient was seen and examined at bedside. Denied headache, dizziness, nausea, vomiting, chest pain, shortness of breath. wanted to go home today.  Discharge Exam: Vitals:   08/08/16 0920 08/08/16 1107  BP: (!) 170/109 (!) 164/114  Pulse: 87 75  Resp: 17 17  Temp: 98 F (36.7 C) 97.6 F (36.4 C)   Vitals:   08/08/16 0400 08/08/16 0742 08/08/16 0920 08/08/16 1107  BP: (!) 142/89 (!) 158/118 (!) 170/109 (!) 164/114  Pulse:  79 87 75  Resp: Temp: 98.2 F (36.8 C) 98.4 F (36.9 C) 98 F (36.7 C) 97.6 F (36.4 C)  TempSrc: Oral Oral Oral Oral  SpO2: 95% 94% 96% 94%  Weight: 76.7 kg (169 lb)     Height:        General: Pt is alert, awake, not in acute distress Cardiovascular: RRR, S1/S2 +, no rubs, no gallops Respiratory: CTA bilaterally, no wheezing, no  rhonchi Abdominal: Soft, NT, ND, bowel sounds + Extremities: no edema, no cyanosis    The results of significant diagnostics from this hospitalization (including imaging, microbiology, ancillary and laboratory) are listed below for reference.     Microbiology: Recent Results (from the past 240 hour(s))  MRSA PCR Screening     Status: None   Collection Time: 08/05/16  3:19 AM  Result Value Ref Range Status   MRSA by PCR NEGATIVE NEGATIVE Final    Comment:        The GeneXpert MRSA Assay (FDA approved for NASAL specimens only), is one component of a comprehensive MRSA colonization surveillance program. It is not intended to diagnose MRSA infection nor to guide or monitor treatment for MRSA infections.      Labs: BNP (last 3 results) No results for input(s): BNP in the last 8760 hours. Basic Metabolic Panel:  Recent  Labs Lab 08/04/16 2140 08/05/16 0314 08/07/16 0302 08/08/16 0311  NA 134* 137 133* 137  K 3.0* 3.1* 2.8* 3.7  CL 100* 100* 101 104  CO2 GLUCOSE 202* 138* 92 96  BUN 22* 22* 28* 30*  CREATININE 3.07* 2.90* 2.97* 3.15*  CALCIUM 8.4* 8.8* 8.3* 8.4*  MG  --   --  1.6* 2.2   Liver Function Tests: No results for input(s): AST, ALT, ALKPHOS, BILITOT, PROT, ALBUMIN in the last 168 hours. No results for input(s): LIPASE, AMYLASE in the last 168 hours. No results for input(s): AMMONIA in the last 168 hours. CBC:  Recent Labs Lab 08/04/16 2140 08/05/16 0314  WBC 11.6* 10.0  NEUTROABS 10.1*  --   HGB 10.3* 10.5*  HCT 30.8* 31.3*  MCV 83.9 84.1  PLT 201 201   Cardiac Enzymes: No results for input(s): CKTOTAL, CKMB, CKMBINDEX, TROPONINI in the last 168 hours. BNP: Invalid input(s): POCBNP CBG:  Recent Labs Lab 08/04/16 2231 08/05/16 0745 08/05/16 1700 08/05/16 2046 08/06/16 2034  GLUCAP 167* 88 111* 115* 119*   D-Dimer No results for input(s): DDIMER in the last 72 hours. Hgb A1c No results for input(s): HGBA1C in the last 72  hours. Lipid Profile  Recent Labs  08/06/16 1044  CHOL 200  HDL 33*  LDLCALC 130*  TRIG 186*  CHOLHDL 6.1   Thyroid function studies No results for input(s): TSH, T4TOTAL, T3FREE, THYROIDAB in the last 72 hours.  Invalid input(s): FREET3 Anemia work up No results for input(s): VITAMINB12, FOLATE, FERRITIN, TIBC, IRON, RETICCTPCT in the last 72 hours. Urinalysis    Component Value Date/Time   COLORURINE STRAW (A) 08/04/2016 2125   APPEARANCEUR CLEAR 08/04/2016 2125   LABSPEC 1.010 08/04/2016 2125   PHURINE 7.0 08/04/2016 2125   GLUCOSEU 150 (A) 08/04/2016 2125   HGBUR SMALL (A) 08/04/2016 2125   BILIRUBINUR NEGATIVE 08/04/2016 2125   KETONESUR NEGATIVE 08/04/2016 2125   PROTEINUR 100 (A) 08/04/2016 2125   UROBILINOGEN 1.0 10/21/2012 1144   NITRITE NEGATIVE 08/04/2016 2125   LEUKOCYTESUR NEGATIVE 08/04/2016 2125   Sepsis Labs Invalid input(s): PROCALCITONIN,  WBC,  LACTICIDVEN Microbiology Recent Results (from the past 240 hour(s))  MRSA PCR Screening     Status: None   Collection Time: 08/05/16  3:19 AM  Result Value Ref Range Status   MRSA by PCR NEGATIVE NEGATIVE Final    Comment:        The GeneXpert MRSA Assay (FDA approved for NASAL specimens only), is one component of a comprehensive MRSA colonization surveillance program. It is not intended to diagnose MRSA infection nor to guide or monitor treatment for MRSA infections.      Time coordinating discharge: 32 minutes  SIGNED:   Maxie Barb, MD  [facility] 08/08/2016, 11:25 AM  If 7PM-7AM, please contact night-coverage [email or web address] Password [email or web address]

## 2016-08-08 NOTE — Care Management Note (Signed)
Case Management Note  Patient Details  Name: Matthew Guerra MRN: [id number] Date of Birth: [birth year]  Subjective/Objective:    Pt presented for Acute Ischemic Stroke. Pt is from home with a friend and plans to return home. Pt is refusing HH Services at this time. Pt states he gets his medications without any problems. Per pt will not need any DME as well.                 Action/Plan: CM did consult with CSW for transportation home. No further needs from CM at this time.   Expected Discharge Date:  08/08/16               Expected Discharge Plan:  Home/Self Care  In-House Referral:  NA  Discharge planning Services  CM Consult  Post Acute Care Choice:  NA Choice offered to:  NA  DME Arranged:  N/A DME Agency:  NA  HH Arranged:  Patient Refused HH HH Agency:  NA  Status of Service:  Completed, signed off  If discussed at Long Length of Stay Meetings, dates discussed:    Additional Comments:  Gala Lewandowsky, RN 08/08/2016, 12:06 PM

## 2016-08-08 NOTE — Progress Notes (Signed)
Patient for discharge home with no apparent distress noted. Waiting for his friend to pick him up. Medications and discharge instructions explained to the patient. He verbalized understanding. Copies given to him including original prescriptions. IV saline lock and tele pack removed
